# Patient Record
Sex: Female | Born: 1985
Health system: Southern US, Community
[De-identification: ages and names within clinical notes are randomized; demographics above are authoritative.]

## PROBLEM LIST (undated history)

## (undated) DIAGNOSIS — F4310 Post-traumatic stress disorder, unspecified: Secondary | ICD-10-CM

## (undated) DIAGNOSIS — I10 Essential (primary) hypertension: Secondary | ICD-10-CM

## (undated) DIAGNOSIS — Z86718 Personal history of other venous thrombosis and embolism: Secondary | ICD-10-CM

## (undated) HISTORY — PX: VENA CAVA FILTER PLACEMENT: SUR1032

## (undated) HISTORY — PX: ABDOMINAL HYSTERECTOMY: SHX81

---

## 2010-12-07 ENCOUNTER — Emergency Department (HOSPITAL_COMMUNITY)
Admission: EM | Admit: 2010-12-07 | Discharge: 2010-12-07 | Disposition: A | Discharge status: Home / Self Care | Payer: Self-pay | Attending: Emergency Medicine | Admitting: Emergency Medicine

## 2010-12-07 DIAGNOSIS — R112 Nausea with vomiting, unspecified: Secondary | ICD-10-CM | POA: Insufficient documentation

## 2010-12-07 DIAGNOSIS — K529 Noninfective gastroenteritis and colitis, unspecified: Secondary | ICD-10-CM

## 2010-12-07 DIAGNOSIS — R109 Unspecified abdominal pain: Secondary | ICD-10-CM | POA: Insufficient documentation

## 2010-12-07 DIAGNOSIS — N390 Urinary tract infection, site not specified: Secondary | ICD-10-CM | POA: Insufficient documentation

## 2010-12-07 DIAGNOSIS — Z86718 Personal history of other venous thrombosis and embolism: Secondary | ICD-10-CM | POA: Insufficient documentation

## 2010-12-07 DIAGNOSIS — I10 Essential (primary) hypertension: Secondary | ICD-10-CM | POA: Insufficient documentation

## 2010-12-07 DIAGNOSIS — K5289 Other specified noninfective gastroenteritis and colitis: Secondary | ICD-10-CM | POA: Insufficient documentation

## 2010-12-07 DIAGNOSIS — R197 Diarrhea, unspecified: Secondary | ICD-10-CM | POA: Insufficient documentation

## 2010-12-07 HISTORY — DX: Personal history of other venous thrombosis and embolism: Z86.718

## 2010-12-07 HISTORY — DX: Essential (primary) hypertension: I10

## 2010-12-07 LAB — URINALYSIS, ROUTINE W REFLEX MICROSCOPIC
Bilirubin Urine: NEGATIVE
Nitrite: NEGATIVE
Specific Gravity, Urine: 1.025 (ref 1.005–1.030)
Urobilinogen, UA: 1 mg/dL (ref 0.0–1.0)
pH: 7.5 (ref 5.0–8.0)

## 2010-12-07 LAB — PREGNANCY, URINE: Preg Test, Ur: NEGATIVE

## 2010-12-07 LAB — DIFFERENTIAL
Basophils Absolute: 0 10*3/uL (ref 0.0–0.1)
Basophils Relative: 0 % (ref 0–1)
Lymphocytes Relative: 38 % (ref 12–46)
Monocytes Absolute: 0.6 10*3/uL (ref 0.1–1.0)
Monocytes Relative: 8 % (ref 3–12)
Neutro Abs: 3.4 10*3/uL (ref 1.7–7.7)
Neutrophils Relative %: 49 % (ref 43–77)

## 2010-12-07 LAB — CBC
HCT: 43.8 % (ref 36.0–46.0)
Hemoglobin: 14.4 g/dL (ref 12.0–15.0)
MCHC: 32.9 g/dL (ref 30.0–36.0)
RBC: 5.53 MIL/uL — ABNORMAL HIGH (ref 3.87–5.11)
RDW: 13.8 % (ref 11.5–15.5)

## 2010-12-07 LAB — POCT I-STAT, CHEM 8
HCT: 48 % — ABNORMAL HIGH (ref 36.0–46.0)
Hemoglobin: 16.3 g/dL — ABNORMAL HIGH (ref 12.0–15.0)
Potassium: 4.4 mEq/L (ref 3.5–5.1)
Sodium: 140 mEq/L (ref 135–145)
TCO2: 28 mmol/L (ref 0–100)

## 2010-12-07 LAB — URINE MICROSCOPIC-ADD ON

## 2010-12-07 MED ORDER — SODIUM CHLORIDE 0.9 % IV SOLN
999.0000 mL | Freq: Once | INTRAVENOUS | Status: AC
  Administered 2010-12-07: 1000 mL via INTRAVENOUS

## 2010-12-07 MED ORDER — ONDANSETRON HCL 4 MG PO TABS: 4.0000 mg | ORAL_TABLET | Freq: Four times a day (QID) | ORAL | Status: AC

## 2010-12-07 MED ORDER — ONDANSETRON 4 MG PO TBDP
ORAL_TABLET | ORAL | Status: AC
  Administered 2010-12-07: 8 mg
  Filled 2010-12-07: qty 2

## 2010-12-07 MED ORDER — ONDANSETRON HCL 4 MG/2ML IJ SOLN
4.0000 mg | Freq: Once | INTRAMUSCULAR | Status: AC
  Administered 2010-12-07: 4 mg via INTRAVENOUS
  Filled 2010-12-07: qty 2

## 2010-12-07 NOTE — ED Notes (Signed)
Abdominal pain with n/v/d, symptoms began yesterday

## 2010-12-07 NOTE — ED Provider Notes (Signed)
History     CSN: [id number] Arrival date & time: 12/07/2010  9:02 AM   First MD Initiated Contact with Patient 12/07/10 [phone]      Chief Complaint  Patient presents with  . 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 female presenting with vomiting. The history is provided by the patient. No language interpreter was used.  Emesis  This is a new problem. The current episode started yesterday. The problem occurs 2 to 4 times per day. The problem has not changed since onset.The emesis has an appearance of stomach contents. There has been no fever. Associated symptoms include abdominal pain and diarrhea. Pertinent negatives include no arthralgias, no chills, no cough, no fever, no headaches, no myalgias and no URI. Risk factors include ill contacts.    Past Medical History  Diagnosis Date  . History of blood clots   . Hypertension     Past Surgical History  Procedure Date  . Abdominal hysterectomy     Family History  Problem Relation Age of Onset  . Hypertension Mother   . Hypertension Father     History  Substance Use Topics  . Smoking status: Never Smoker   . Smokeless tobacco: Never Used  . Alcohol Use: No    OB History    Grav Para Term Preterm Abortions TAB SAB Ect Mult Living                  Review of Systems  Constitutional: Negative for fever and chills.  Respiratory: Negative for cough.   Gastrointestinal: Positive for vomiting,  abdominal pain and diarrhea.  Musculoskeletal: Negative for myalgias and arthralgias.  Neurological: Negative for headaches.  All other systems reviewed and are negative.    Allergies  Review of patient's allergies indicates no known allergies.  Home Medications  No current outpatient prescriptions on file.  BP 113/63  Pulse 85  Temp(Src) 98.1 F (36.7 C) (Oral)  Resp 18  Ht 5\' 9"  (1.753 m)  Wt 220 lb (99.791 kg)  BMI 32.49 kg/m2  SpO2 98%  LMP 08/06/2010  Physical Exam  Nursing note and vitals reviewed. Constitutional: She is oriented to person, place, and time. She appears well-developed and well-nourished.  HENT:  Head: Normocephalic and atraumatic.  Right Ear: External ear normal.  Left Ear: External ear normal.  Mouth/Throat: Oropharynx is clear and moist.  Eyes: Pupils are equal, round, and reactive to light. No scleral icterus.  Neck: Normal range of motion. Neck supple.  Cardiovascular: Normal rate, regular rhythm, normal heart sounds and intact distal pulses.   Pulmonary/Chest: Effort normal and breath sounds normal. No respiratory distress.  Abdominal: Soft. Bowel sounds are normal. She exhibits no distension. There is no tenderness.  Musculoskeletal: Normal range of motion.  Neurological: She is alert and oriented to person, place, and time.  Skin: Skin is warm and dry.  Psychiatric: She has a normal mood and affect. Her behavior is normal. Judgment and thought content normal.    ED Course  Procedures (  including critical care time)  Labs Reviewed  CBC - Abnormal; Notable for the following:    RBC 5.53 (*)    All other components within normal limits  URINALYSIS, ROUTINE W REFLEX MICROSCOPIC - Abnormal; Notable for the following:    Appearance CLOUDY (*)    Leukocytes, UA MODERATE (*)    All other components within normal limits  POCT I-STAT, CHEM 8 - Abnormal; Notable for the following:    Hemoglobin 16.3 (*)    HCT 48.0 (*)    All other components  within normal limits  URINE MICROSCOPIC-ADD ON - Abnormal; Notable for the following:    Squamous Epithelial / LPF MANY (*)    Bacteria, UA FEW (*)    All other components within normal limits  DIFFERENTIAL  PREGNANCY, URINE  I-STAT, CHEM 8   No results found.   Gastroenteritis   MDM  ED course - patient was given a liter of fluids here and zofran 4mg  IV - she was then able to tolerate po fluids and crackers - she is also noted to have a UTI and I will treat this as well.        Izola Price [place], [place] 12/07/10 1226

## 2010-12-07 NOTE — ED Notes (Signed)
Pt states that she is feeling better after the zofran

## 2010-12-07 NOTE — ED Provider Notes (Signed)
Medical screening examination/treatment/procedure(s) were performed by non-physician practitioner and as supervising physician I was immediately available for consultation/collaboration.  Doug Sou, MD 12/07/10 [phone]

## 2010-12-07 NOTE — ED Notes (Signed)
Oral trial complete. Pt tolerating well.

## 2012-10-30 DIAGNOSIS — I82409 Acute embolism and thrombosis of unspecified deep veins of unspecified lower extremity: Secondary | ICD-10-CM | POA: Insufficient documentation

## 2013-02-19 DIAGNOSIS — I2699 Other pulmonary embolism without acute cor pulmonale: Secondary | ICD-10-CM | POA: Insufficient documentation

## 2013-02-22 DIAGNOSIS — N63 Unspecified lump in unspecified breast: Secondary | ICD-10-CM | POA: Insufficient documentation

## 2014-08-08 ENCOUNTER — Emergency Department (HOSPITAL_COMMUNITY)
Admission: EM | Admit: 2014-08-08 | Discharge: 2014-08-09 | Disposition: A | Discharge status: Home / Self Care | Payer: Medicaid Other | Attending: Emergency Medicine | Admitting: Emergency Medicine

## 2014-08-08 ENCOUNTER — Encounter (HOSPITAL_COMMUNITY): Payer: Self-pay | Admitting: Vascular Surgery

## 2014-08-08 DIAGNOSIS — G8929 Other chronic pain: Secondary | ICD-10-CM | POA: Diagnosis not present

## 2014-08-08 DIAGNOSIS — I10 Essential (primary) hypertension: Secondary | ICD-10-CM | POA: Insufficient documentation

## 2014-08-08 DIAGNOSIS — Z86718 Personal history of other venous thrombosis and embolism: Secondary | ICD-10-CM | POA: Diagnosis not present

## 2014-08-08 DIAGNOSIS — Z86711 Personal history of pulmonary embolism: Secondary | ICD-10-CM | POA: Insufficient documentation

## 2014-08-08 DIAGNOSIS — A5901 Trichomonal vulvovaginitis: Secondary | ICD-10-CM | POA: Diagnosis not present

## 2014-08-08 DIAGNOSIS — D6869 Other thrombophilia: Secondary | ICD-10-CM | POA: Diagnosis not present

## 2014-08-08 DIAGNOSIS — M79662 Pain in left lower leg: Secondary | ICD-10-CM | POA: Diagnosis present

## 2014-08-08 DIAGNOSIS — Z7901 Long term (current) use of anticoagulants: Secondary | ICD-10-CM | POA: Insufficient documentation

## 2014-08-08 DIAGNOSIS — A5900 Urogenital trichomoniasis, unspecified: Secondary | ICD-10-CM

## 2014-08-08 LAB — COMPREHENSIVE METABOLIC PANEL
ALBUMIN: 3.8 g/dL (ref 3.5–5.0)
ALK PHOS: 61 U/L (ref 38–126)
ALT: 17 U/L (ref 14–54)
ANION GAP: 9 (ref 5–15)
AST: 18 U/L (ref 15–41)
BUN: 11 mg/dL (ref 6–20)
CALCIUM: 8.8 mg/dL — AB (ref 8.9–10.3)
CHLORIDE: 104 mmol/L (ref 101–111)
CO2: 23 mmol/L (ref 22–32)
CREATININE: 0.96 mg/dL (ref 0.44–1.00)
GFR calc Af Amer: >60 mL/min (ref >60)
Glucose, Bld: 86 mg/dL (ref 65–99)
Potassium: 3.9 mmol/L (ref 3.5–5.1)
SODIUM: 136 mmol/L (ref 135–145)
Total Bilirubin: 0.4 mg/dL (ref 0.3–1.2)
Total Protein: 6.4 g/dL — ABNORMAL LOW (ref 6.5–8.1)

## 2014-08-08 LAB — CBC
HCT: 39.9 % (ref 36.0–46.0)
Hemoglobin: 12.8 g/dL (ref 12.0–15.0)
MCH: 25.5 pg — AB (ref 26.0–34.0)
MCHC: 32.1 g/dL (ref 30.0–36.0)
MCV: 79.6 fL (ref 78.0–100.0)
Platelets: 274 10*3/uL (ref 150–400)
RBC: 5.01 MIL/uL (ref 3.87–5.11)
RDW: 14.1 % (ref 11.5–15.5)
WBC: 8.3 10*3/uL (ref 4.0–10.5)

## 2014-08-08 LAB — LIPASE, BLOOD: LIPASE: 21 U/L — AB (ref 22–51)

## 2014-08-08 NOTE — ED Notes (Signed)
Pt reports to the ED for eval of left leg pain. She reports it feels similar to the last time she had a DVT in that leg. She is currently being treated with Coumadin but she has not had any since 2 months ago. She also has not had an INR check since then. Reports her Medicaid just got approved. No swelling or erythema noted to the affected leg. Pt also reports lower abd pain. Reports associated nausea and one episode of emesis. Denies any hematemesis or diarrhea. Denies any vaginal bleeding or d/c. Hx hysterectomy. Denies any fevers or chills. Pt A&Ox4, resp e/u, and skin warm and dry.

## 2014-08-08 NOTE — ED Notes (Signed)
Pt also requested pelvic exam to check for STDs as well.

## 2014-08-09 ENCOUNTER — Ambulatory Visit (HOSPITAL_COMMUNITY)
Admission: RE | Admit: 2014-08-09 | Discharge: 2014-08-09 | Disposition: A | Discharge status: Home / Self Care | Payer: Medicaid Other | Source: Ambulatory Visit | Attending: Emergency Medicine | Admitting: Emergency Medicine

## 2014-08-09 DIAGNOSIS — M79609 Pain in unspecified limb: Secondary | ICD-10-CM | POA: Diagnosis not present

## 2014-08-09 DIAGNOSIS — Z86711 Personal history of pulmonary embolism: Secondary | ICD-10-CM | POA: Insufficient documentation

## 2014-08-09 DIAGNOSIS — Z90710 Acquired absence of both cervix and uterus: Secondary | ICD-10-CM | POA: Diagnosis not present

## 2014-08-09 DIAGNOSIS — Z86718 Personal history of other venous thrombosis and embolism: Secondary | ICD-10-CM | POA: Diagnosis not present

## 2014-08-09 DIAGNOSIS — M79652 Pain in left thigh: Secondary | ICD-10-CM | POA: Insufficient documentation

## 2014-08-09 LAB — WET PREP, GENITAL
Clue Cells Wet Prep HPF POC: NONE SEEN
Yeast Wet Prep HPF POC: NONE SEEN

## 2014-08-09 LAB — URINALYSIS, ROUTINE W REFLEX MICROSCOPIC
Glucose, UA: NEGATIVE mg/dL
HGB URINE DIPSTICK: NEGATIVE
Ketones, ur: 15 mg/dL — AB
LEUKOCYTES UA: NEGATIVE
NITRITE: NEGATIVE
PH: 5.5 (ref 5.0–8.0)
PROTEIN: NEGATIVE mg/dL
SPECIFIC GRAVITY, URINE: 1.037 — AB (ref 1.005–1.030)
Urobilinogen, UA: 1 mg/dL (ref 0.0–1.0)

## 2014-08-09 LAB — GC/CHLAMYDIA PROBE AMP (~~LOC~~) NOT AT ARMC
Chlamydia: NEGATIVE
Neisseria Gonorrhea: NEGATIVE

## 2014-08-09 LAB — D-DIMER, QUANTITATIVE (NOT AT ARMC): D DIMER QUANT: 0.66 ug{FEU}/mL — AB (ref 0.00–0.48)

## 2014-08-09 LAB — HIV ANTIBODY (ROUTINE TESTING W REFLEX): HIV Screen 4th Generation wRfx: NONREACTIVE

## 2014-08-09 LAB — RPR: RPR Ser Ql: NONREACTIVE

## 2014-08-09 MED ORDER — METRONIDAZOLE 500 MG PO TABS
2000.0000 mg | ORAL_TABLET | Freq: Once | ORAL | Status: AC
  Administered 2014-08-09: 2000 mg via ORAL
  Filled 2014-08-09: qty 4

## 2014-08-09 MED ORDER — LIDOCAINE HCL (PF) 1 % IJ SOLN
0.9000 mL | Freq: Once | INTRAMUSCULAR | Status: AC
  Administered 2014-08-09: 0.9 mL

## 2014-08-09 MED ORDER — AZITHROMYCIN 250 MG PO TABS
1000.0000 mg | ORAL_TABLET | Freq: Once | ORAL | Status: AC
  Administered 2014-08-09: 1000 mg via ORAL
  Filled 2014-08-09: qty 4

## 2014-08-09 MED ORDER — XARELTO VTE STARTER PACK 15 & 20 MG PO TBPK: 15.0000 mg | ORAL_TABLET | ORAL | Status: DC

## 2014-08-09 MED ORDER — ENOXAPARIN SODIUM 100 MG/ML ~~LOC~~ SOLN
100.0000 mg | Freq: Once | SUBCUTANEOUS | Status: AC
  Administered 2014-08-09: 100 mg via SUBCUTANEOUS
  Filled 2014-08-09: qty 1

## 2014-08-09 MED ORDER — CEFTRIAXONE SODIUM 250 MG IJ SOLR
250.0000 mg | Freq: Once | INTRAMUSCULAR | Status: AC
Start: 2014-08-09 — End: 2014-08-09
  Administered 2014-08-09: 250 mg via INTRAMUSCULAR
  Filled 2014-08-09: qty 250

## 2014-08-09 NOTE — ED Provider Notes (Signed)
CSN: [id number]     Arrival date & time 08/08/14  2106 History   First MD Initiated Contact with Patient 08/08/14 2342     Chief Complaint  Patient presents with  . Leg Pain  . Abdominal Pain     (Consider location/radiation/quality/duration/timing/severity/associated sxs/prior Treatment) HPI Comments: This a 29 year old with history of PEs and DVTs, is supposed to be on Coumadin, who has not taken in the past 2 months due to financial concerns.  She also states that she has left lower quadrant and suprapubic pain and is concerned for an STD. Denies any recent trips trauma to her lower extremities, shortness of breath, chest pain. She states she was unable to afford physician office visits or her medications.  She recently has been reapproved for Medicaid so this will not issue in the future  Patient is a 29 y.o. female presenting with leg pain and abdominal pain. The history is provided by the patient.  Leg Pain Location:  Leg Injury: no   Leg location:  L lower leg Pain details:    Quality:  Aching   Radiates to:  Does not radiate   Severity:  Mild   Onset quality:  Gradual   Timing:  Constant   Progression:  Worsening Chronicity:  Chronic Dislocation: no   Foreign body present:  No foreign bodies Tetanus status:  Unknown Prior injury to area:  No Relieved by:  None tried Worsened by:  Activity Ineffective treatments:  None tried Associated symptoms: no fever   Risk factors comment:  Chronic DVTs and pulmonary emboli Abdominal Pain Associated symptoms: dysuria   Associated symptoms: no chest pain, no fever, no nausea, no shortness of breath, no vaginal discharge and no vomiting     Past Medical History  Diagnosis Date  . History of blood clots   . Hypertension    Past Surgical History  Procedure Laterality Date  . Abdominal hysterectomy     Family History  Problem Relation Age of Onset  . Hypertension Mother   . Hypertension Father    History  Substance Use  Topics  . Smoking status: Never Smoker   . Smokeless tobacco: Never Used  . Alcohol Use: No   OB History    No data available     Review of Systems  Constitutional: Negative for fever.  Respiratory: Negative for shortness of breath.   Cardiovascular: Positive for leg swelling. Negative for chest pain.  Gastrointestinal: Positive for abdominal pain. Negative for nausea and vomiting.  Genitourinary: Positive for dysuria and vaginal pain. Negative for frequency and vaginal discharge.  All other systems reviewed and are negative.     Allergies  Review of patient's allergies indicates no known allergies.  Home Medications   Prior to Admission medications   Medication Sig Start Date End Date Taking? Authorizing Provider  XARELTO STARTER PACK 15 & 20 MG TBPK Take 15-20 mg by mouth as directed. Take as directed on package: Start with one  tablet by mouth twice a day with food. On Day 22, switch to one  tablet once a day with food. 08/09/14   Earley Favor, NP   BP 108/54 mmHg  Pulse 68  Temp(Src) 97.4 F (36.3 C) (Oral)  Resp 16  SpO2 100%  LMP 08/06/2010 Physical Exam  Constitutional: She appears well-developed and well-nourished.  HENT:  Head: Normocephalic.  Eyes: Pupils are equal, round, and reactive to light.  Neck: Normal range of motion.  Cardiovascular: Normal rate.   Pulmonary/Chest: Effort normal and  breath sounds normal.  Abdominal: Soft. Bowel sounds are normal. She exhibits no distension. There is no tenderness.  Genitourinary: Uterus is not enlarged and not tender. Cervix exhibits motion tenderness and discharge. Left adnexum displays tenderness. Vaginal discharge found.  Musculoskeletal: Normal range of motion.  Neurological: She is alert.  Nursing note and vitals reviewed.   ED Course  Procedures (including critical care time) Labs Review Labs Reviewed  WET PREP, GENITAL - Abnormal; Notable for the following:    Trich, Wet Prep FEW (*)    WBC, Wet  Prep HPF POC FEW (*)    All other components within normal limits  LIPASE, BLOOD - Abnormal; Notable for the following:    Lipase 21 (*)    All other components within normal limits  COMPREHENSIVE METABOLIC PANEL - Abnormal; Notable for the following:    Calcium 8.8 (*)    Total Protein 6.4 (*)    All other components within normal limits  CBC - Abnormal; Notable for the following:    MCH 25.5 (*)    All other components within normal limits  D-DIMER, QUANTITATIVE (NOT AT [facility]) - Abnormal; Notable for the following:    D-Dimer, Quant 0.66 (*)    All other components within normal limits  URINALYSIS, ROUTINE W REFLEX MICROSCOPIC (NOT AT [facility]) - Abnormal; Notable for the following:    Color, Urine AMBER (*)    Specific Gravity, Urine 1.037 (*)    Bilirubin Urine SMALL (*)    Ketones, ur 15 (*)    All other components within normal limits  URINALYSIS, ROUTINE W REFLEX MICROSCOPIC (NOT AT [facility])  RPR  HIV ANTIBODY (ROUTINE TESTING)  GC/CHLAMYDIA PROBE AMP () NOT AT [facility]    Imaging Review No results found.   EKG Interpretation None     Patient has a positive d-dimer.  She was given her first dose of Lovenox in the emergency department.  She's been given a prescription for Xarelto starter pack, which she has been instructed to start tomorrow.  She's also been scheduled for an outpatient ultrasound of her left lower extremity tomorrow, and she's been instructed to call her primary care physician and set an appointment with in the next week She was tested positive for trichomonas.  She received 2 g off Flagyl in the emergency department, as well as receiving azithromycin and Rocephin for potential GC and Chlamydia cultures are pending MDM   Final diagnoses:  Urogenital infection by trichomonas vaginalis  Secondary hypercoagulability disorder         Earley Favor, NP 08/09/14 0150  Derwood Kaplan, MD 08/09/14 2213

## 2014-08-09 NOTE — Discharge Instructions (Signed)
Trichomoniasis Trichomoniasis is an infection caused by an organism called Trichomonas. The infection can affect both women and men. In women, the outer female genitalia and the vagina are affected. In men, the penis is mainly affected, but the prostate and other reproductive organs can also be involved. Trichomoniasis is a sexually transmitted infection (STI) and is most often passed to another person through sexual contact.  RISK FACTORS  Having unprotected sexual intercourse.  Having sexual intercourse with an infected partner. SIGNS AND SYMPTOMS  Symptoms of trichomoniasis in women include:  Abnormal gray-green frothy vaginal discharge.  Itching and irritation of the vagina.  Itching and irritation of the area outside the vagina. Symptoms of trichomoniasis in men include:   Penile discharge with or without pain.  Pain during urination. This results from inflammation of the urethra. DIAGNOSIS  Trichomoniasis may be found during a Pap test or physical exam. Your health care provider may use one of the following methods to help diagnose this infection:  Examining vaginal discharge under a microscope. For men, urethral discharge would be examined.  Testing the pH of the vagina with a test tape.  Using a vaginal swab test that checks for the Trichomonas organism. A test is available that provides results within a few minutes.  Doing a culture test for the organism. This is not usually needed. TREATMENT   You may be given medicine to fight the infection. Women should inform their health care provider if they could be or are pregnant. Some medicines used to treat the infection should not be taken during pregnancy.  Your health care provider may recommend over-the-counter medicines or creams to decrease itching or irritation.  Your sexual partner will need to be treated if infected. HOME CARE INSTRUCTIONS   Take medicines only as directed by your health care provider.  Take  over-the-counter medicine for itching or irritation as directed by your health care provider.  Do not have sexual intercourse while you have the infection.  Women should not douche or wear tampons while they have the infection.  Discuss your infection with your partner. Your partner may have gotten the infection from you, or you may have gotten it from your partner.  Have your sex partner get examined and treated if necessary.  Practice safe, informed, and protected sex.  See your health care provider for other STI testing. SEEK MEDICAL CARE IF:   You still have symptoms after you finish your medicine.  You develop abdominal pain.  You have pain when you urinate.  You have bleeding after sexual intercourse.  You develop a rash.  Your medicine makes you sick or makes you throw up (vomit). MAKE SURE YOU:  Understand these instructions.  Will watch your condition.  Will get help right away if you are not doing well or get worse. Document Released: 06/30/2000 Document Revised: 05/21/2013 Document Reviewed: 10/16/2012 [facility] Patient Information 2015 [place], [place]. This information is not intended to replace advice given to you by your health care provider. Make sure you discuss any questions you have with your health care provider.  Anticoagulation, Generic Anticoagulants are medicines used to prevent clots from developing in your veins. These medicine are also known as blood thinners. If blood clots are untreated, they could travel to your lungs. This is called a pulmonary embolus. A blood clot in your lungs can be fatal.  Health care providers often use anticoagulants to prevent clots following surgery. Anticoagulants are also used along with aspirin when the heart is not getting  enough blood. Another anticoagulant called warfarin is started 2 to 3 days after a rapid-acting injectable anticoagulant is started. The rapid-acting anticoagulants are usually continued until  warfarin has begun to work. Your health care provider will judge this length of time by blood tests known as the prothrombin time (PT) and International Normalization Ratio (INR). This means that your blood is at the necessary and best level to prevent clots. RISKS AND COMPLICATIONS  If you have received recent epidural anesthesia, spinal anesthesia, or a spinal tap while receiving anticoagulants, you are at risk for developing a blood clot in or around the spine. This condition could result in long-term or permanent paralysis.  Because anticoagulants thin your blood, severe bleeding may occur from any tissue or organ. Symptoms of the blood being too thin may include:  Bleeding from the nose or gums that does not stop quickly.  Blood in bowel movements which may appear as bright red, dark, or black tarry stools.  Blood in the urine which may appear as pink, red, or brown urine.  Unusual bruising or bruising easily.  A cut that does not stop bleeding within 10 minutes.  Vomiting blood or continuous nausea for more than 1 day.  Coughing up blood.  Broken blood vessels in your eye (subconjunctival hemorrhage).  Abdominal or back pain with or without flank bruising.  Sudden, severe headache.  Sudden weakness or numbness of the face, arm, or leg, especially on one side of the body.  Sudden confusion.  Trouble speaking (aphasia) or understanding.  Sudden trouble seeing in one or both eyes.  Sudden trouble walking.  Dizziness.  Loss of balance or coordination.  Vaginal bleeding.  Swelling or pain at an injection site.  Superficial fat tissue death (necrosis) which may cause skin scarring. This is more common in women and may first present as pain in the waist, thighs, or buttocks.  Fever.  Too little anticoagulation continues to allow the risk for blood clots. HOME CARE INSTRUCTIONS   Due to the complications of anticoagulants, it is very important that you take your  anticoagulant as directed by your health care provider. Anticoagulants need to be taken exactly as instructed. Be sure you understand all your anticoagulant instructions.  Keep all follow-up appointments with your health care provider as directed. It is very important to keep your appointments. Not keeping appointments could result in a chronic or permanent injury, pain, or disability.  Warfarin. Your health care provider will advise you on the length of treatment (usually 3-6 months, sometimes lifelong).  Take warfarin exactly as directed by your health care provider. It is recommended that you take your warfarin dose at the same time of the day. It is preferred that you take warfarin in the late afternoon. If you have been told to stop taking warfarin, do not resume taking warfarin until directed to do so by your health care provider. Follow your health care provider's instructions if you accidentally take an extra dose or miss a dose of warfarin. It is very important to take warfarin as directed since bleeding or blood clots could result in chronic or permanent injury, pain, or disability.  Too much and too little warfarin are both dangerous. Too much warfarin increases the risk of bleeding. Too little warfarin continues to allow the risk for blood clots. While taking warfarin, you will need to have regular blood tests to measure your blood clotting time. These blood tests usually include both the prothrombin time (PT) and International Normalized Ratio (INR)  tests. The PT and INR results allow your health care provider to adjust your dose of warfarin. The dose can change for many reasons. It is critically important that you have your PT and INR levels drawn exactly as directed. Your warfarin dose may stay the same or change depending on what the PT and INR results are. Be sure to follow up with your health care provider regarding your PT and INR test results and what your warfarin dosage should  be.  Many medicines can interfere with warfarin and affect the PT and INR results. You must tell your health care provider about any and all medicines you take, this includes all vitamins and supplements. Ask your health care provider before taking these. Prescription and over-the-counter medicine consistency is critical to warfarin management. It is important that potential interactions are checked before you start a new medicine. Be especially cautious with aspirin and anti-inflammatory medicines. Ask your health care provider before taking these. Medicines such as antibiotics and acid-reducing medicine can interact with warfarin and can cause an increased warfarin effect. Warfarin can also interfere with the effectiveness of medicines you are taking. Do not take or discontinue any prescribed or over-the-counter medicine except on the advice of your health care provider or pharmacist.  Some vitamins, supplements, and herbal products interfere with the effectiveness of warfarin. Vitamin E may increase the anticoagulant effects of warfarin. Vitamin K may can cause warfarin to be less effective. Do not take or discontinue any vitamin, supplement, or herbal product except on the advice of your health care provider or pharmacist.  Eat what you normally eat and keep the vitamin K content of your diet consistent. Avoid major changes in your diet, or notify your health care provider before changing your diet. Suddenly getting a lot more vitamin K could cause your blood to clot too quickly. A sudden decrease in vitamin K intake could cause your blood to clot too slowly. These changes in vitamin K intake could lead to dangerous blood clotsor to bleeding. To keep your vitamin K intake consistent, you must be aware of which foods contain moderate or high amounts of vitamin K. Some foods high in vitamin K include spinach, kale, broccoli, cabbage, greens, Brussels sprouts, asparagus, Bok [place]oy, coleslaw, parsley, and  green tea. Arrange a visit with a dietitian to answer your questions.  If you have a loss of appetite or get the stomach flu (viral gastroenteritis), talk to your health care provider as soon as possible. A decrease in your normal vitamin K intake can make you more sensitive to your usual dose of warfarin.  Some medical conditions may increase your risk for bleeding while you are taking warfarin. A fever, diarrhea lasting more than a day, worsening heart failure, or worsening liver function are some medical conditions that could affect warfarin. Contact your health care provider if you have any of these medical conditions.  Alcohol can change the body's ability to handle warfarin. It is best to avoid alcoholic drinks or consume only very small amounts while taking warfarin. Notify your health care provider if you change your alcohol intake. A sudden increase in alcohol use can increase your risk of bleeding. Chronic alcohol use can cause warfarin to be less effective.  Be careful not to cut yourself when using sharp objects or while shaving.  Inform all your health care providers and your dentist that you take an anticoagulant.  Limit physical activities or sports that could result in a fall or cause injury. Avoid  contact sports.  Wear medical alert jewelry or carry a medical alert card. SEEK IMMEDIATE MEDICAL CARE IF:  You cough up blood.  You have dark or black stools or there is bright red blood coming from your rectum.  You vomit blood or have nausea for more than 1 day.  You have blood in the urine or pink colored urine.  You have unusual bruising or have increased bruising.  You have bleeding from the nose or gums that does not stop quickly.  You have a cut that does not stop bleeding within a 2-3 minutes.  You have sudden weakness or numbness of the face, arm, or leg, especially on one side of the body.  You have sudden confusion.  You have trouble speaking (aphasia) or  understanding.  You have sudden trouble seeing in one or both eyes.  You have sudden trouble walking.  You have dizziness.  You have a loss of balance or coordination.  You have a sudden, severe headache.  You have a serious fall or head injury, even if you are not bleeding.  You have swelling or pain at an injection site.  You have unexplained tenderness or pain in the abdomen, back, waist, thighs or buttocks.  You have a fever. Any of these symptoms may represent a serious problem that is an emergency. Do not wait to see if the symptoms will go away. Get medical help right away. Call your local emergency services (911 in [place] Do not drive yourself to the hospital. Document Released: 01/04/2005 Document Revised: 01/09/2013 Document Reviewed: 08/09/2007 [facility] Patient Information 2015 [place], [place]. This information is not intended to replace advice given to you by your health care provider. Make sure you discuss any questions you have with your health care provider. You have been given your first dose of Lovenox in the emergency department tonight.  You've been scheduled for an outpatient Doppler study of your left lower leg tomorrow.  You've also been given a prescription for medication called Xarelto as a starter pack.  Please follow the directions on the label.  Please make an appointment with your primary care physician to be seen within the next week

## 2014-08-09 NOTE — ED Notes (Signed)
Patient is alert and orientedx4.  Patient was explained discharge instructions and they understood them with no questions.  The patient's sister, Glennie Hawk is taking the patient home.

## 2014-08-09 NOTE — Progress Notes (Signed)
VASCULAR LAB PRELIMINARY  PRELIMINARY  PRELIMINARY  PRELIMINARY  Left lower extremity venous duplex completed.    Preliminary report:  Left:  No evidence of DVT, superficial thrombosis, or Baker's cyst.  Arine Foley, RVT 08/09/2014, 9:55 AM

## 2014-08-30 ENCOUNTER — Emergency Department (HOSPITAL_COMMUNITY)
Admission: EM | Admit: 2014-08-30 | Discharge: 2014-08-30 | Disposition: A | Discharge status: Home / Self Care | Payer: Medicaid Other | Attending: Emergency Medicine | Admitting: Emergency Medicine

## 2014-08-30 ENCOUNTER — Encounter (HOSPITAL_COMMUNITY): Payer: Self-pay | Admitting: Emergency Medicine

## 2014-08-30 ENCOUNTER — Emergency Department (HOSPITAL_COMMUNITY): Payer: Medicaid Other

## 2014-08-30 DIAGNOSIS — Z8659 Personal history of other mental and behavioral disorders: Secondary | ICD-10-CM | POA: Diagnosis not present

## 2014-08-30 DIAGNOSIS — R061 Stridor: Secondary | ICD-10-CM | POA: Insufficient documentation

## 2014-08-30 DIAGNOSIS — Z7901 Long term (current) use of anticoagulants: Secondary | ICD-10-CM | POA: Insufficient documentation

## 2014-08-30 DIAGNOSIS — R791 Abnormal coagulation profile: Secondary | ICD-10-CM | POA: Insufficient documentation

## 2014-08-30 DIAGNOSIS — R110 Nausea: Secondary | ICD-10-CM | POA: Insufficient documentation

## 2014-08-30 DIAGNOSIS — M5442 Lumbago with sciatica, left side: Secondary | ICD-10-CM | POA: Insufficient documentation

## 2014-08-30 DIAGNOSIS — Z87891 Personal history of nicotine dependence: Secondary | ICD-10-CM | POA: Diagnosis not present

## 2014-08-30 DIAGNOSIS — R079 Chest pain, unspecified: Secondary | ICD-10-CM | POA: Insufficient documentation

## 2014-08-30 DIAGNOSIS — R197 Diarrhea, unspecified: Secondary | ICD-10-CM | POA: Diagnosis not present

## 2014-08-30 DIAGNOSIS — I10 Essential (primary) hypertension: Secondary | ICD-10-CM | POA: Insufficient documentation

## 2014-08-30 DIAGNOSIS — Z86711 Personal history of pulmonary embolism: Secondary | ICD-10-CM | POA: Insufficient documentation

## 2014-08-30 DIAGNOSIS — Z86718 Personal history of other venous thrombosis and embolism: Secondary | ICD-10-CM | POA: Insufficient documentation

## 2014-08-30 HISTORY — DX: Post-traumatic stress disorder, unspecified: F43.10

## 2014-08-30 LAB — BASIC METABOLIC PANEL
Anion gap: 6 (ref 5–15)
BUN: 7 mg/dL (ref 6–20)
CHLORIDE: 108 mmol/L (ref 101–111)
CO2: 27 mmol/L (ref 22–32)
CREATININE: 0.79 mg/dL (ref 0.44–1.00)
Calcium: 9.1 mg/dL (ref 8.9–10.3)
GFR calc non Af Amer: >60 mL/min (ref >60)
GLUCOSE: 89 mg/dL (ref 65–99)
Potassium: 3.8 mmol/L (ref 3.5–5.1)
Sodium: 141 mmol/L (ref 135–145)

## 2014-08-30 LAB — I-STAT TROPONIN, ED: Troponin i, poc: 0 ng/mL (ref 0.00–0.08)

## 2014-08-30 LAB — CBC
HEMATOCRIT: 40.8 % (ref 36.0–46.0)
HEMOGLOBIN: 13.1 g/dL (ref 12.0–15.0)
MCH: 25.7 pg — AB (ref 26.0–34.0)
MCHC: 32.1 g/dL (ref 30.0–36.0)
MCV: 80 fL (ref 78.0–100.0)
Platelets: 274 10*3/uL (ref 150–400)
RBC: 5.1 MIL/uL (ref 3.87–5.11)
RDW: 14.2 % (ref 11.5–15.5)
WBC: 6.1 10*3/uL (ref 4.0–10.5)

## 2014-08-30 LAB — URINALYSIS, ROUTINE W REFLEX MICROSCOPIC
BILIRUBIN URINE: NEGATIVE
GLUCOSE, UA: NEGATIVE mg/dL
Hgb urine dipstick: NEGATIVE
KETONES UR: NEGATIVE mg/dL
Leukocytes, UA: NEGATIVE
Nitrite: NEGATIVE
PROTEIN: NEGATIVE mg/dL
Specific Gravity, Urine: 1.023 (ref 1.005–1.030)
Urobilinogen, UA: 1 mg/dL (ref 0.0–1.0)
pH: 7.5 (ref 5.0–8.0)

## 2014-08-30 LAB — PROTIME-INR
INR: 0.96 (ref 0.00–1.49)
PROTHROMBIN TIME: 12.9 s (ref 11.6–15.2)

## 2014-08-30 MED ORDER — PREDNISONE 20 MG PO TABS
60.0000 mg | ORAL_TABLET | Freq: Once | ORAL | Status: AC
  Administered 2014-08-30: 60 mg via ORAL
  Filled 2014-08-30: qty 3

## 2014-08-30 MED ORDER — PREDNISONE 20 MG PO TABS: 40.0000 mg | ORAL_TABLET | Freq: Every day | ORAL | Status: DC

## 2014-08-30 MED ORDER — IOHEXOL 350 MG/ML SOLN
80.0000 mL | Freq: Once | INTRAVENOUS | Status: AC | PRN
  Administered 2014-08-30: 80 mL via INTRAVENOUS

## 2014-08-30 MED ORDER — SODIUM CHLORIDE 0.9 % IV BOLUS (SEPSIS)
1000.0000 mL | Freq: Once | INTRAVENOUS | Status: AC
  Administered 2014-08-30: 1000 mL via INTRAVENOUS

## 2014-08-30 MED ORDER — ONDANSETRON HCL 4 MG/2ML IJ SOLN
4.0000 mg | Freq: Once | INTRAMUSCULAR | Status: AC
  Administered 2014-08-30: 4 mg via INTRAVENOUS
  Filled 2014-08-30: qty 2

## 2014-08-30 MED ORDER — HYDROCODONE-ACETAMINOPHEN 5-325 MG PO TABS: 2.0000 | ORAL_TABLET | ORAL | Status: DC | PRN

## 2014-08-30 MED ORDER — KETOROLAC TROMETHAMINE 30 MG/ML IJ SOLN
30.0000 mg | Freq: Once | INTRAMUSCULAR | Status: AC
  Administered 2014-08-30: 30 mg via INTRAVENOUS
  Filled 2014-08-30: qty 1

## 2014-08-30 NOTE — ED Notes (Signed)
Patient c/o lower back pain and left hip pain x 1 month.  Patient states she began having throbbing chest pain at 2am this morning.  Patient denies nausea, shortness of breath or diaphoresis.  Patient states she had diarrhea yesterday.

## 2014-08-30 NOTE — ED Notes (Signed)
Pt brought back to room via wheelchair; pt undressed, in gown, on monitor, continuous pulse oximetry and blood pressure cuff; French Ana, RN present in room

## 2014-08-30 NOTE — ED Provider Notes (Signed)
CSN: [id number]     Arrival date & time 08/30/14  1018 History   First MD Initiated Contact with Patient 08/30/14 1023     Chief Complaint  Patient presents with  . Chest Pain  . 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  Her chest pain began at 2 AM, and is located in her central chest him and described as a "poking, stinging, pulling," sensation which is worsened with laying down, deep inspiration and worse with exertion, is intermittent in nature, and is associated with shortness of breath, orthopnea and PND. She states that she sleeps better and has less pain when propped up at night, she often wakes up when trying to lay flat, feeling short of breath and "in a panic."  She denies any cough, cold-like symptoms, wheeze, lower extremity edema, or fatigue.   Patient is also complaining of left sided, low back and left buttock pain that radiates to the back of her left leg with associated tingling and numbness. It is made worse with movement and with any position that puts her weight on her left side.  She has had difficulty driving and has to sit on the pillow. She states she has had this pain since about 2007, and on his done anything for it but give her pain meds.  Her back and leg pain is bothering her worse than her chest pain and due to severely increased mobility of the primary reason she has come in to be evaluated today. Patient also has had 2 episodes of dark green watery diarrhea which began when she woke up at 2 AM. She has some associated nausea, but does not have fever, chills or sweats. She has not had any vomiting. She denies hematochezia, melena, abdominal pain, sick contacts, recent travel, or exposure to new foods or undercooked  foods.  Past Medical History  Diagnosis Date  . History of blood clots   . Hypertension   . PTSD (post-traumatic stress disorder)    Past Surgical History  Procedure Laterality Date  . Abdominal hysterectomy     Family History  Problem Relation Age of Onset  . Hypertension Mother   . Hypertension Father    Social History  Substance Use Topics  . Smoking status: Former Games developer  . Smokeless tobacco: Never Used  . Alcohol Use: No   OB History    No data available     Review of Systems  Constitutional: Negative.   Eyes: Negative.   Respiratory: Positive for stridor. Negative for cough, choking, chest tightness and wheezing.   Cardiovascular: Negative for palpitations and leg swelling.  Gastrointestinal: Positive for nausea and diarrhea. Negative for vomiting, abdominal pain, constipation, blood in stool, abdominal distention, anal bleeding and rectal pain.  Endocrine: Negative.   Genitourinary: Negative.   Musculoskeletal: Positive for back pain and gait problem. Negative for myalgias, joint swelling, neck pain and neck stiffness.  Neurological: Negative.   Hematological: Negative.   Psychiatric/Behavioral: Negative.       Allergies  Review of patient's allergies indicates no known allergies.  Home Medications   Prior to Admission medications   Medication Sig Start Date End Date Taking? Authorizing Provider  XARELTO STARTER PACK 15 & 20 MG TBPK Take 15-20 mg by mouth as directed. Take as directed on package: Start with one  15mg  tablet by mouth twice a day with food. On Day 22, switch to one 20mg  tablet once a day with food. 08/09/14   Earley Favor, NP   BP 133/69 mmHg  Pulse 75  Temp(Src) 98.2 F (36.8 C) (Oral)  Resp 20  Ht 5\' 9"  (1.753 m)  Wt 220 lb (99.791 kg)  BMI 32.47 kg/m2  SpO2 100%  LMP 08/06/2010 Physical Exam  Constitutional: She is oriented to person, place, and time. She appears well-developed and well-nourished. No distress.  HENT:  Head:  Normocephalic and atraumatic.  Nose: Nose normal.  Mouth/Throat: Oropharynx is clear and moist. No oropharyngeal exudate.  Eyes: Conjunctivae and EOM are normal. Pupils are equal, round, and reactive to light. Right eye exhibits no discharge. Left eye exhibits no discharge. No scleral icterus.  Neck: Normal range of motion. No JVD present. No tracheal deviation present. No thyromegaly present.  Cardiovascular: Normal rate, regular rhythm, normal heart sounds and intact distal pulses.  Exam reveals no gallop and no friction rub.   No murmur heard. Pulmonary/Chest: Effort normal and breath sounds normal. No accessory muscle usage. No respiratory distress. She has no decreased breath sounds. She has no wheezes. She has no rhonchi. She has no rales. She exhibits no tenderness. Left breast exhibits no nipple discharge, no skin change and no tenderness.    Lungs clear to auscultation anteriorly and posteriorly without crackles, wheezes or rhonchi, no increased work of breathing, no respiratory distress. With deep inspiration patient experienced pain  Abdominal: Soft. Bowel sounds are normal. She exhibits no distension and no mass. There is no tenderness. There is no rebound and no guarding.  Abdomen soft, obese, nontender, nondistended, normal bowel sounds 4, no guarding or rebound  Musculoskeletal: She exhibits tenderness. She exhibits no edema.  No spinal tenderness, from cervical to lumbar spine, no step-off palpated on spinous processes. Tender to palpation over left SI joint,  Positive modified straight leg raise, left hip normal ROM, but painful flexion and extension.   Left knee normal ROM  Lymphadenopathy:    She has no cervical adenopathy.  Neurological: She is alert and oriented to person, place, and time. She has normal reflexes. No cranial nerve deficit or sensory deficit. She exhibits normal muscle tone. Gait abnormal. Coordination normal.  Antalgic gait  Skin: Skin is warm and dry. No  rash noted. She is not diaphoretic. No erythema. No pallor.  No lower extremity edema, all extremities warm to the touch, no pallor, normal cap refill  Psychiatric: She has a normal mood and affect. Her behavior is normal. Judgment and thought content normal.  Nursing note and vitals reviewed.   ED Course  Procedures (including critical care time) Labs Review Labs Reviewed  CBC - Abnormal; Notable for the following:    MCH 25.7 (*)    All other components within normal limits  BASIC METABOLIC PANEL  I-STAT TROPOININ, ED    Imaging Review No results found. I, Danelle Berry, personally reviewed and evaluated these images and lab results as part of my medical decision-making.   EKG Interpretation None      MDM   Final diagnoses:  None      Pt with CP, negative trop, neg CT angio chest, suptherapeutic INR -  Double dose for 2 days and contact managing doctor for follow-up, if unable to be seen (pt states difficulty getting anyone to answer or return phone calls) pt advised to present back to ED to monitor pt/inr. Pt was aware of lump  on left breast - pt notified of incidental findings on CT angio - pt will f/up with OB/GYN. Pt had sciatic back pain on the left - given tx with pain meds and steroid taper.      Danelle Berry, PA-C 09/05/14 1610  Jerelyn Scott, MD 09/06/14 [phone]

## 2014-08-30 NOTE — ED Notes (Signed)
Discharge instructions and prescriptions given and reviewed with patient.  Patient verbalized understanding to take medications as directed and to follow up with PMD as needed.  Patient discharged home in good condition.  

## 2014-08-30 NOTE — Discharge Instructions (Signed)
Your INR level was found to be too low to be therapeutic.  Double your dose for 2 days and call your doctor to follow up on your dosing and levels.    Chest Wall Pain Chest wall pain is pain felt in or around the chest bones and muscles. It may take up to 6 weeks to get better. It may take longer if you are active. Chest wall pain can happen on its own. Other times, things like germs, injury, coughing, or exercise can cause the pain. HOME CARE   Avoid activities that make you tired or cause pain. Try not to use your chest, belly (abdominal), or side muscles. Do not use heavy weights.  Put ice on the sore area.  Put ice in a plastic bag.  Place a towel between your skin and the bag.  Leave the ice on for 15-20 minutes for the first 2 days.  Only take medicine as told by your doctor. GET HELP RIGHT AWAY IF:   You have more pain or are very uncomfortable.  You have a fever.  Your chest pain gets worse.  You have new problems.  You feel sick to your stomach (nauseous) or throw up (vomit).  You start to sweat or feel lightheaded.  You have a cough with mucus (phlegm).  You cough up blood. MAKE SURE YOU:   Understand these instructions.  Will watch your condition.  Will get help right away if you are not doing well or get worse. Document Released: 06/23/2007 Document Revised: 03/29/2011 Document Reviewed: 08/31/2010 [facility] Patient Information 2015 [place], [place]. This information is not intended to replace advice given to you by your health care provider. Make sure you discuss any questions you have with your health care provider.  Sciatica Sciatica is pain, weakness, numbness, or tingling along the path of the sciatic nerve. The nerve starts in the lower back and runs down the back of each leg. The nerve controls the muscles in the lower leg and in the back of the knee, while also providing sensation to the back of the thigh, lower leg, and the sole of your foot. Sciatica  is a symptom of another medical condition. For instance, nerve damage or certain conditions, such as a herniated disk or bone spur on the spine, pinch or put pressure on the sciatic nerve. This causes the pain, weakness, or other sensations normally associated with sciatica. Generally, sciatica only affects one side of the body. CAUSES   Herniated or slipped disc.  Degenerative disk disease.  A pain disorder involving the narrow muscle in the buttocks (piriformis syndrome).  Pelvic injury or fracture.  Pregnancy.  Tumor (rare). SYMPTOMS  Symptoms can vary from mild to very severe. The symptoms usually travel from the low back to the buttocks and down the back of the leg. Symptoms can include:  Mild tingling or dull aches in the lower back, leg, or hip.  Numbness in the back of the calf or sole of the foot.  Burning sensations in the lower back, leg, or hip.  Sharp pains in the lower back, leg, or hip.  Leg weakness.  Severe back pain inhibiting movement. These symptoms may get worse with coughing, sneezing, laughing, or prolonged sitting or standing. Also, being overweight may worsen symptoms. DIAGNOSIS  Your caregiver will perform a physical exam to look for common symptoms of sciatica. He or she may ask you to do certain movements or activities that would trigger sciatic nerve pain. Other tests may be  performed to find the cause of the sciatica. These may include:  Blood tests.  X-rays.  Imaging tests, such as an MRI or CT scan. TREATMENT  Treatment is directed at the cause of the sciatic pain. Sometimes, treatment is not necessary and the pain and discomfort goes away on its own. If treatment is needed, your caregiver may suggest:  Over-the-counter medicines to relieve pain.  Prescription medicines, such as anti-inflammatory medicine, muscle relaxants, or narcotics.  Applying heat or ice to the painful area.  Steroid injections to lessen pain, irritation, and  inflammation around the nerve.  Reducing activity during periods of pain.  Exercising and stretching to strengthen your abdomen and improve flexibility of your spine. Your caregiver may suggest losing weight if the extra weight makes the back pain worse.  Physical therapy.  Surgery to eliminate what is pressing or pinching the nerve, such as a bone spur or part of a herniated disk. HOME CARE INSTRUCTIONS   Only take over-the-counter or prescription medicines for pain or discomfort as directed by your caregiver.  Apply ice to the affected area for 20 minutes, 3-4 times a day for the first 48-72 hours. Then try heat in the same way.  Exercise, stretch, or perform your usual activities if these do not aggravate your pain.  Attend physical therapy sessions as directed by your caregiver.  Keep all follow-up appointments as directed by your caregiver.  Do not wear high heels or shoes that do not provide proper support.  Check your mattress to see if it is too soft. A firm mattress may lessen your pain and discomfort. SEEK IMMEDIATE MEDICAL CARE IF:   You lose control of your bowel or bladder (incontinence).  You have increasing weakness in the lower back, pelvis, buttocks, or legs.  You have redness or swelling of your back.  You have a burning sensation when you urinate.  You have pain that gets worse when you lie down or awakens you at night.  Your pain is worse than you have experienced in the past.  Your pain is lasting longer than 4 weeks.  You are suddenly losing weight without reason. MAKE SURE YOU:  Understand these instructions.  Will watch your condition.  Will get help right away if you are not doing well or get worse. Document Released: 12/29/2000 Document Revised: 07/06/2011 Document Reviewed: 05/16/2011 [facility] Patient Information 2015 [place], [place]. This information is not intended to replace advice given to you by your health care provider. Make sure you  discuss any questions you have with your health care provider.  Vitamin K Foods and Warfarin Warfarin is a medicine that helps prevent harmful blood clots by causing blood to clot more slowly. It does this by decreasing the activity of vitamin K, which promotes normal blood clotting. For the dose of warfarin you have been prescribed to work well, you need to get about the same amount of vitamin K from your food from day to day. Suddenly getting a lot more vitamin K could cause your blood to clot too quickly. A sudden decrease in vitamin K intake could cause your blood to clot too slowly. These changes in vitamin K intake could lead to dangerous blood clotsor to bleeding. WHAT GENERAL GUIDELINES DO I NEED TO FOLLOW?  Keep your intake of vitamin K consistent from day to day. To do this, you must be aware of which foods contain moderate or high amounts of vitamin K. Listed below are some foods that are very high, high,  or moderately high in vitamin K. If you eat these foods, make sure you eat a consistent amount of them from day to day.  Avoid major changes in your diet, or tell your health care provider before changing your diet.  If you take a multivitamin that contains vitamin K, be sure to take it every day.  If you drink green tea, drink the same amount each day. WHAT FOODS ARE VERY HIGH IN VITAMIN K?   Greens, such as Swiss chard and beet, collard, mustard, or turnip greens (fresh or frozen, cooked).  Kale (fresh or frozen, cooked).   Parsley (raw).  Spinach (cooked).  WHAT FOODS ARE HIGH IN VITAMIN K?  Asparagus (frozen, cooked).  Beans, green (frozen, cooked).  Broccoli.   Bok choy (cooked).   Brussels sprouts (fresh or frozen, cooked).  Cabbage (cooked).  Coleslaw. WHAT FOODS ARE MODERATELY HIGH IN VITAMIN K?  Blueberries.  Black-eyed peas.  Endive (raw).   Green leaf lettuce (raw).   Green scallions (raw).  Kale (raw).  Okra (frozen,  cooked).  Plantains (fried).  Romaine lettuce (raw).   Sauerkraut (canned).   Spinach (raw). Document Released: 11/01/2008 Document Revised: 01/09/2013 Document Reviewed: 11/08/2012 [facility] Patient Information 2015 [place], [place]. This information is not intended to replace advice given to you by your health care provider. Make sure you discuss any questions you have with your health care provider.  Piriformis Syndrome with Rehab Piriformis syndrome is a condition the affects the nervous system in the area of the hip, and is characterized by pain and possibly a loss of feeling in the backside (posterior) thigh that may extend down the entire length of the leg. The symptoms are caused by an increase in pressure on the sciatic nerve by the piriformis muscle, which is on the back of the hip and is responsible for externally rotating the hip. The sciatic nerve and its branches connect to much of the leg. Normally the sciatic nerve runs between the piriformis muscle and other muscles. However, in certain individuals the nerve runs through the muscle, which causes an increase in pressure on the nerve and results in the symptoms of piriformis syndrome. SYMPTOMS   Pain, tingling, numbness, or burning in the back of the thigh that may also extend down the entire leg.  Occasionally, tenderness in the buttock.  Loss of function of the leg.  Pain that worsens when using the piriformis muscle (running, jumping, or stairs).  Pain that increases with prolonged sitting.  Pain that is lessened by lying flat on the back. CAUSES   Piriformis syndrome is the result of an increase in pressure placed on the sciatic nerve. Oftentimes, piriformis syndrome is an overuse injury.  Stress placed on the nerve from a sudden increase in the intensity, frequency, or duration of training.  Compensation of other extremity injuries. RISK INCREASES WITH:  Sports that involve the piriformis muscle (running,  walking, or jumping).  You are born with (congenital) a defect in which the sciatic nerve passes through the muscle. PREVENTION  Warm up and stretch properly before activity.  Allow for adequate recovery between workouts.  Maintain physical fitness:  Strength, flexibility, and endurance.  Cardiovascular fitness. PROGNOSIS  If treated properly, the symptoms of piriformis syndrome usually resolve in 2 to 6 weeks. RELATED COMPLICATIONS   Persistent and possibly permanent pain and numbness in the lower extremity.  Weakness of the extremity that may progress to disability and inability to compete. TREATMENT  The most effective treatment for piriformis  syndrome is rest from any activities that aggravate the symptoms. Ice and pain medication may help reduce pain and inflammation. The use of strengthening and stretching exercises may help reduce pain with activity. These exercises may be performed at home or with a therapist. A referral to a therapist may be given for further evaluation and treatment, such as ultrasound. Corticosteroid injections may be given to reduce inflammation that is causing pressure to be placed on the sciatic nerve. If nonsurgical (conservative) treatment is unsuccessful, then surgery may be recommended.  MEDICATION   If pain medication is necessary, then nonsteroidal anti-inflammatory medications, such as aspirin and ibuprofen, or other minor pain relievers, such as acetaminophen, are often recommended.  Do not take pain medication for 7 days before surgery.  Prescription pain relievers may be given if deemed necessary by your caregiver. Use only as directed and only as much as you need.  Corticosteroid injections may be given by your caregiver. These injections should be reserved for the most serious cases, because they may only be given a certain number of times. HEAT AND COLD:   Cold treatment (icing) relieves pain and reduces inflammation. Cold treatment should  be applied for 10 to 15 minutes every 2 to 3 hours for inflammation and pain and immediately after any activity that aggravates your symptoms. Use ice packs or massage the area with a piece of ice (ice massage).  Heat treatment may be used prior to performing the stretching and strengthening activities prescribed by your caregiver, physical therapist, or athletic trainer. Use a heat pack or soak the injury in warm water. SEEK IMMEDIATE MEDICAL CARE IF:  Treatment seems to offer no benefit, or the condition worsens.  Any medications produce adverse side effects. EXERCISES RANGE OF MOTION (ROM) AND STRETCHING EXERCISES - Piriformis Syndrome These exercises may help you when beginning to rehabilitate your injury. Your symptoms may resolve with or without further involvement from your physician, physical therapist, or athletic trainer. While completing these exercises, remember:   Restoring tissue flexibility helps normal motion to return to the joints. This allows healthier, less painful movement and activity.  An effective stretch should be held for at least 30 seconds.  A stretch should never be painful. You should only feel a gentle lengthening or release in the stretched tissue. STRETCH - Hip Rotators  Lie on your back on a firm surface. Grasp your right / left knee with your right / left hand and your ankle with your opposite hand.  Keeping your hips and shoulders firmly planted, gently pull your right / left knee and rotate your lower leg toward your opposite shoulder until you feel a stretch in your buttocks.  Hold this stretch for __________ seconds. Repeat this stretch __________ times. Complete this stretch __________ times per day. STRETCH - Iliotibial Band  On the floor or bed, lie on your side so your right / left leg is on top. Bend your knee and grab your ankle.  Slowly bring your knee back so that your thigh is in line with your trunk. Keep your heel at your buttocks and  gently arch your back so your head, shoulders, and hips line up.  Slowly lower your leg so that your knee approaches the floor/bed until you feel a gentle stretch on the outside of your right / left thigh. If you do not feel a stretch and your knee will not fall farther, place the heel of your opposite foot on top of your knee and pull your thigh  down farther.  Hold this stretch for __________ seconds. Repeat __________ times. Complete __________ times per day. STRENGTHENING EXERCISES - Piriformis Syndrome  These are some of the caregiver again or until your symptoms are resolved. Remember:   Strong muscles with good endurance tolerate stress better.  Do the exercises as initially prescribed by your caregiver. Progress slowly with each exercise, gradually increasing the number of repetitions and weight used under their guidance. STRENGTH - Hip Abductors, Straight Leg Raises Be aware of your form throughout the entire exercise so that you exercise the correct muscles. Sloppy form means that you are not strengthening the correct muscles.  Lie on your side so that your head, shoulders, knee, and hip line up. You may bend your lower knee to help maintain your balance. Your right / left leg should be on top.  Roll your hips slightly forward, so that your hips are stacked directly over each other and your right / left knee is facing forward.  Lift your top leg up 4-6 inches, leading with your heel. Be sure that your foot does not drift forward or that your knee does not roll toward the ceiling.  Hold this position for __________ seconds. You should feel the muscles in your outer hip lifting (you may not notice this until your leg begins to tire).  Slowly lower your leg to the starting position. Allow the muscles to fully relax before beginning the next repetition. Repeat __________ times. Complete this exercise __________ times per day.  STRENGTH - Hip Abductors, Quadruped  On a firm, lightly  padded surface, position yourself on your hands and knees. Your hands should be directly below your shoulders and your knees should be directly below your hips.  Keeping your right / left knee bent, lift your leg out to the side. Keep your legs level and in line with your shoulders.  Position yourself on your hands and knees.  Hold for __________ seconds.  Keeping your trunk steady and your hips level, slowly lower your leg to the starting position. Repeat __________ times. Complete this exercise __________ times per day.  STRENGTH - Hip Abductors, Standing  Tie one end of a rubber exercise band/tubing to a secure surface (table, pole) and tie a loop at the other end.  Place the loop around your right / left ankle. Keeping your ankle with the band directly opposite of the secured end, step away until there is tension in the tube/band.  Hold onto a chair as needed for balance.  Keeping your back upright, your shoulders over your hips, and your toes pointing forward, lift your right / left leg out to your side. Be sure to lift your leg with your hip muscles. Do not "throw" your leg or tip your body to lift your leg.  Slowly and with control, return to the starting position. Repeat exercise __________ times. Complete this exercise __________ times per day.  Document Released: 01/04/2005 Document Revised: 05/21/2013 Document Reviewed: 04/18/2008 [facility] Patient Information 2015 [place], [place]. This information is not intended to replace advice given to you by your health care provider. Make sure you discuss any questions you have with your health care provider.

## 2015-06-02 ENCOUNTER — Emergency Department (HOSPITAL_COMMUNITY)
Admission: EM | Admit: 2015-06-02 | Discharge: 2015-06-03 | Disposition: A | Discharge status: Home / Self Care | Payer: Medicaid Other | Attending: Emergency Medicine | Admitting: Emergency Medicine

## 2015-06-02 ENCOUNTER — Emergency Department (HOSPITAL_COMMUNITY): Payer: Medicaid Other

## 2015-06-02 ENCOUNTER — Encounter (HOSPITAL_COMMUNITY): Payer: Self-pay | Admitting: *Deleted

## 2015-06-02 DIAGNOSIS — Z79899 Other long term (current) drug therapy: Secondary | ICD-10-CM | POA: Diagnosis not present

## 2015-06-02 DIAGNOSIS — Z7901 Long term (current) use of anticoagulants: Secondary | ICD-10-CM | POA: Insufficient documentation

## 2015-06-02 DIAGNOSIS — Z8659 Personal history of other mental and behavioral disorders: Secondary | ICD-10-CM | POA: Diagnosis not present

## 2015-06-02 DIAGNOSIS — R079 Chest pain, unspecified: Secondary | ICD-10-CM | POA: Diagnosis present

## 2015-06-02 DIAGNOSIS — R0789 Other chest pain: Secondary | ICD-10-CM | POA: Diagnosis not present

## 2015-06-02 DIAGNOSIS — Z9119 Patient's noncompliance with other medical treatment and regimen: Secondary | ICD-10-CM | POA: Insufficient documentation

## 2015-06-02 DIAGNOSIS — Z87891 Personal history of nicotine dependence: Secondary | ICD-10-CM | POA: Insufficient documentation

## 2015-06-02 DIAGNOSIS — I10 Essential (primary) hypertension: Secondary | ICD-10-CM | POA: Diagnosis not present

## 2015-06-02 DIAGNOSIS — Z86718 Personal history of other venous thrombosis and embolism: Secondary | ICD-10-CM | POA: Insufficient documentation

## 2015-06-02 DIAGNOSIS — N63 Unspecified lump in breast: Secondary | ICD-10-CM | POA: Diagnosis not present

## 2015-06-02 DIAGNOSIS — N6310 Unspecified lump in the right breast, unspecified quadrant: Secondary | ICD-10-CM

## 2015-06-02 DIAGNOSIS — Z9114 Patient's other noncompliance with medication regimen: Secondary | ICD-10-CM

## 2015-06-02 LAB — CBC
HCT: 42 % (ref 36.0–46.0)
Hemoglobin: 13.5 g/dL (ref 12.0–15.0)
MCH: 25.6 pg — AB (ref 26.0–34.0)
MCHC: 32.1 g/dL (ref 30.0–36.0)
MCV: 79.5 fL (ref 78.0–100.0)
PLATELETS: 309 10*3/uL (ref 150–400)
RBC: 5.28 MIL/uL — AB (ref 3.87–5.11)
RDW: 13.6 % (ref 11.5–15.5)
WBC: 8.7 10*3/uL (ref 4.0–10.5)

## 2015-06-02 LAB — BASIC METABOLIC PANEL
Anion gap: 7 (ref 5–15)
BUN: 14 mg/dL (ref 6–20)
CHLORIDE: 106 mmol/L (ref 101–111)
CO2: 27 mmol/L (ref 22–32)
Calcium: 9.7 mg/dL (ref 8.9–10.3)
Creatinine, Ser: 0.84 mg/dL (ref 0.44–1.00)
GFR calc non Af Amer: >60 mL/min (ref >60)
GLUCOSE: 91 mg/dL (ref 65–99)
Potassium: 3.9 mmol/L (ref 3.5–5.1)
SODIUM: 140 mmol/L (ref 135–145)

## 2015-06-02 LAB — PROTIME-INR
INR: 1.02 (ref 0.00–1.49)
PROTHROMBIN TIME: 13.6 s (ref 11.6–15.2)

## 2015-06-02 LAB — I-STAT TROPONIN, ED: Troponin i, poc: 0 ng/mL (ref 0.00–0.08)

## 2015-06-02 MED ORDER — RIVAROXABAN (XARELTO) VTE STARTER PACK (15 & 20 MG): ORAL_TABLET | ORAL | Status: DC

## 2015-06-02 MED ORDER — ACETAMINOPHEN 325 MG PO TABS
650.0000 mg | ORAL_TABLET | Freq: Once | ORAL | Status: AC
  Administered 2015-06-02: 650 mg via ORAL
  Filled 2015-06-02: qty 2

## 2015-06-02 MED ORDER — RIVAROXABAN (XARELTO) EDUCATION KIT FOR DVT/PE PATIENTS
PACK | Freq: Once | Status: AC
  Administered 2015-06-02: 23:00:00
  Filled 2015-06-02: qty 1

## 2015-06-02 MED ORDER — IOPAMIDOL (ISOVUE-370) INJECTION 76%
INTRAVENOUS | Status: AC
  Administered 2015-06-02: 100 mL
  Filled 2015-06-02: qty 100

## 2015-06-02 MED ORDER — RIVAROXABAN 15 MG PO TABS
15.0000 mg | ORAL_TABLET | Freq: Once | ORAL | Status: AC
  Administered 2015-06-02: 15 mg via ORAL
  Filled 2015-06-02: qty 1

## 2015-06-02 NOTE — ED Notes (Signed)
C/o CP and leg pain, concerned for clot, h/o same, has not been taking coumadin or lovenox d/t cost, CP resolved, leg pain remains, alert, NAD, calm, interactive, resps e/u, speaking in complete sentences, no dyspnea noted, skin W&D. EDPA into room.

## 2015-06-02 NOTE — ED Notes (Signed)
Xarelto discussed with pt, pt verbalizes understanding.

## 2015-06-02 NOTE — ED Notes (Signed)
Lisa PA at bedside

## 2015-06-02 NOTE — ED Provider Notes (Signed)
CSN: [id number]     Arrival date & time 06/02/15  1735 History   First MD Initiated Contact with Patient 06/02/15 2012     Chief Complaint  Patient presents with  . Chest Pain     (Consider location/radiation/quality/duration/timing/severity/associated sxs/prior Treatment) Patient is a 30 y.o. female presenting with chest pain. The history is provided by the patient and medical records.  Chest Pain  30 year old female with history of DVT and PE, hypertension, PTSD, presenting to the ED for chest pain. Patient states today at work she began coughing and then had 1 episode of posttussive emesis. She states afterwards she developed some chest pain and shortness of breath. She states pain is like a "squeezing" sensation in the center of her chest. She states this has improved at this time, however has not completely resolved. She denies any shortness of breath currently. No reported fever or chills.  Patient does have history of multiple PEs in the past. She is supposed to be on Coumadin and Lovenox, however she has been off of this for approximately 3 weeks. She states she was not able to afford the visit to her physician to get a refill of her prescriptions.  She also has IVC filter in place.  Patient also reports some chronic leg pain. She states it is worse at night. She reports this is been ongoing since she had her total hysterectomy several years ago. Denies any numbness or weakness of her labs. No bowel or bladder changes.  No back pain.  VSS.  Past Medical History  Diagnosis Date  . History of blood clots   . Hypertension   . PTSD (post-traumatic stress disorder)    Past Surgical History  Procedure Laterality Date  . Abdominal hysterectomy     Family History  Problem Relation Age of Onset  . Hypertension Mother   . Hypertension Father    Social History  Substance Use Topics  . Smoking status: Former Games developer  . Smokeless tobacco: Never Used  . Alcohol Use: No   OB History    No data available     Review of Systems  Cardiovascular: Positive for chest pain.  All other systems reviewed and are negative.     Allergies  Review of patient's allergies indicates no known allergies.  Home Medications   Prior to Admission medications   Medication Sig Start Date End Date Taking? Authorizing Provider  citalopram (CELEXA) 20 MG tablet Take 20 mg by mouth daily.    Historical Provider, MD  haloperidol (HALDOL) 0.5 MG tablet Take 0.5 mg by mouth at bedtime.    Historical Provider, MD  HYDROcodone-acetaminophen (NORCO/VICODIN) 5-325 MG per tablet Take 2 tablets by mouth every 4 (four) hours as needed. 08/30/14   Danelle Berry, PA-C  predniSONE (DELTASONE) 20 MG tablet Take 2 tablets (40 mg total) by mouth daily. Take 40 mg by mouth daily for 3 days, then 20mg  by mouth daily for 3 days, then 10mg  daily for 3 days 08/30/14   Danelle Berry, PA-C  warfarin (COUMADIN) 2 MG tablet Take 2 mg by mouth daily.    Historical Provider, MD  XARELTO STARTER PACK 15 & 20 MG TBPK Take 15-20 mg by mouth as directed. Take as directed on package: Start with one 15mg  tablet by mouth twice a day with food. On Day 22, switch to one 20mg  tablet once a day with food. Patient not taking: Reported on 08/30/2014 08/09/14   Earley Favor, NP   BP 116/75 mmHg  Pulse  75  Temp(Src) 98.4 F (36.9 C) (Oral)  Resp 18  Ht 5\' 9"  (1.753 m)  Wt 95.074 kg  BMI 30.94 kg/m2  SpO2 100%  LMP 08/06/2010   Physical Exam  Constitutional: She is oriented to person, place, and time. She appears well-developed and well-nourished. No distress.  HENT:  Head: Normocephalic and atraumatic.  Mouth/Throat: Oropharynx is clear and moist.  Eyes: Conjunctivae and EOM are normal. Pupils are equal, round, and reactive to light.  Neck: Normal range of motion. Neck supple.  Cardiovascular: Normal rate, regular rhythm and normal heart sounds.   Pulmonary/Chest: Effort normal and breath sounds normal. No respiratory distress. She  has no wheezes.  Mild tenderness of left chest wall without deformity, no signs of distress, lungs overall clear  Abdominal: Soft. Bowel sounds are normal. There is no tenderness. There is no guarding.  Musculoskeletal: Normal range of motion.  No peripheral edema, calf swelling, redness, or tenderness  Neurological: She is alert and oriented to person, place, and time.  Skin: Skin is warm and dry. She is not diaphoretic.  Psychiatric: She has a normal mood and affect.  Nursing note and vitals reviewed.   ED Course  Procedures (including critical care time) Labs Review Labs Reviewed  CBC - Abnormal; Notable for the following:    RBC 5.28 (*)    MCH 25.6 (*)    All other components within normal limits  BASIC METABOLIC PANEL  PROTIME-INR  Rosezena SensorI-STAT TROPOININ, ED    Imaging Review Dg Chest 2 View  06/02/2015  CLINICAL DATA:  Chest pain.  Shortness of breath. EXAM: CHEST  2 VIEW COMPARISON:  08/30/2014 FINDINGS: IVC filter observed projecting over the abdomen. The lungs appear clear. Cardiac and mediastinal contours normal. No pleural effusion identified. IMPRESSION: No active cardiopulmonary disease. Electronically Signed   By: Gaylyn RongWalter  Liebkemann M.D.   On: 06/02/2015 18:56   Ct Angio Chest Pe W/cm &/or Wo Cm  06/02/2015  CLINICAL DATA:  Pt c/o CP, SOB. States pain is radiating to legs and has been ongoing for several years. Pt states her coumadin had run out so has not taken any for sometime now. EXAM: CT ANGIOGRAPHY CHEST WITH CONTRAST TECHNIQUE: Multidetector CT imaging of the chest was performed using the standard protocol during bolus administration of intravenous contrast. Multiplanar CT image reconstructions and MIPs were obtained to evaluate the vascular anatomy. CONTRAST:  80 mL Isovue COMPARISON:  CT 08/30/2014 FINDINGS: Mediastinum/Nodes: No axillary lymphadenopathy. Again demonstrated a 3.2 cm mass in the medial RIGHT breast. No mediastinal or hilar lymphadenopathy.  No  pericardial fluid. No filling defects within the pulmonary arteries to suggest acute pulmonary embolism. Aorta poorly opacified. Lungs/Pleura: No pulmonary infarction. No pleural fluid or edema. No pneumonia. Upper abdomen: Limited view of the liver, kidneys, pancreas are unremarkable. Normal adrenal glands. Musculoskeletal: No aggressive osseous lesion. Review of the MIP images confirms the above findings. IMPRESSION: 1. No evidence acute pulmonary embolism. 2. No acute pulmonary parenchymal abnormalities. 3. Large RIGHT breast mass again demonstrated. Ultrasound was recommended for evaluation of this lesion on CT of 08/30/2014. In the absence of documented workup in the EMR, recommend diagnostic ultrasound of the RIGHT breast. Electronically Signed   By: Genevive BiStewart  Edmunds M.D.   On: 06/02/2015 22:04   I have personally reviewed and evaluated these images and lab results as part of my medical decision-making.   EKG Interpretation None      MDM   Final diagnoses:  Chest pain, unspecified chest pain type  Non compliance w medication regimen  Breast mass, right   30 year old female here with chest pain. She has a history of DVT and PE, has been noncompliant with her Coumadin. Patient is afebrile, nontoxic. Her vital signs are stable. Her EKG is sinus rhythm without acute ischemic changes. Initial lab work including troponin is reassuring. Her chest x-ray is clear. Given patient's history, diagnosis of PE must be considered. CT angios of chest obtained, no evidence of PE. Patient again has noted right breast mass on CT. This has been known to patient for several months, however she is not yet followed up. I had a long discussion with patient regarding medication compliance. She states she has had difficulty with the Coumadin due to frequent INR checks. Feel she may benefit from changing to Xarelto, it appears she took this in the past and tolerated it well. Case management has reviewed use of Xarelto and  trial pack with her in the ED, she acknowledged understanding. I have ordered an outpatient right breast ultrasound for further evaluation of the mass seen on CT as well. I have encouraged her to follow up with her primary care physician regarding both of these concerns discussed during this ED visit.  Discussed plan with patient, she acknowledged understanding and agreed with plan of care.  Return precautions given for new or worsening symptoms.  Garlon Hatchet, PA-C 06/03/15 0011  Benjiman Core, MD 06/04/15 [phone]

## 2015-06-02 NOTE — ED Notes (Signed)
Pt states that she has been having pain all over  With chest pain and SOB. Pt states that there pain is worse in her legs. Pt states that this pain has been ongoing for several years. Pt reports hx of PEs. Pt states that she had a coughing episode this morning and vomitted x1. Pt states that she is out of her coumadin as well.

## 2015-06-02 NOTE — Discharge Instructions (Signed)
Take the prescribed medication as directed.  It is important you take this medication as directed to prevent further blood clots. Follow-up with the breast center to have ultrasound of right breast performed.  Please do this in a timely manner. Follow-up with your primary care doctor. Return to the ED for new or worsening symptoms.

## 2015-06-02 NOTE — ED Notes (Signed)
Patient transported to CT 

## 2015-07-24 ENCOUNTER — Other Ambulatory Visit: Payer: Self-pay | Admitting: General Practice

## 2015-07-24 ENCOUNTER — Emergency Department (HOSPITAL_COMMUNITY)
Admission: EM | Admit: 2015-07-24 | Discharge: 2015-07-24 | Disposition: A | Discharge status: Home / Self Care | Payer: Medicaid Other | Attending: Emergency Medicine | Admitting: Emergency Medicine

## 2015-07-24 ENCOUNTER — Encounter (HOSPITAL_COMMUNITY): Payer: Self-pay

## 2015-07-24 DIAGNOSIS — Z87891 Personal history of nicotine dependence: Secondary | ICD-10-CM | POA: Insufficient documentation

## 2015-07-24 DIAGNOSIS — N6310 Unspecified lump in the right breast, unspecified quadrant: Secondary | ICD-10-CM

## 2015-07-24 DIAGNOSIS — Z7901 Long term (current) use of anticoagulants: Secondary | ICD-10-CM | POA: Insufficient documentation

## 2015-07-24 DIAGNOSIS — I10 Essential (primary) hypertension: Secondary | ICD-10-CM | POA: Insufficient documentation

## 2015-07-24 DIAGNOSIS — N644 Mastodynia: Secondary | ICD-10-CM | POA: Insufficient documentation

## 2015-07-24 NOTE — Discharge Instructions (Signed)
Please follow with your primary care doctor in the next 2 days for a check-up. They must obtain records for further management.   Do not hesitate to return to the Emergency Department for any new, worsening or concerning symptoms.    Breast Tenderness Breast tenderness is a common problem for women of all ages. Breast tenderness may cause mild discomfort to severe pain. It has a variety of causes. Your health care provider will find out the likely cause of your breast tenderness by examining your breasts, asking you about symptoms, and ordering some tests. Breast tenderness usually does not mean you have breast cancer. HOME CARE INSTRUCTIONS  Breast tenderness often can be handled at home. You can try:  Getting fitted for a new bra that provides more support, especially during exercise.  Wearing a more supportive bra or sports bra while sleeping when your breasts are very tender.  If you have a breast injury, apply ice to the area:  Put ice in a plastic bag.  Place a towel between your skin and the bag.  Leave the ice on for 20 minutes, 2-3 times a day.  If your breasts are too full of milk as a result of breastfeeding, try:  Expressing milk either by hand or with a breast pump.  Applying a warm compress to the breasts for relief.  Taking over-the-counter pain relievers, if approved by your health care provider.  Taking other medicines that your health care provider prescribes. These may include antibiotic medicines or birth control pills. Over the long term, your breast tenderness might be eased if you:  Cut down on caffeine.  Reduce the amount of fat in your diet. Keep a log of the days and times when your breasts are most tender. This will help you and your health care provider find the cause of the tenderness and how to relieve it. Also, learn how to do breast exams at home. This will help you notice if you have an unusual growth or lump that could cause tenderness. SEEK  MEDICAL CARE IF:   Any part of your breast is hard, red, and hot to the touch. This could be a sign of infection.  Fluid is coming out of your nipples (and you are not breastfeeding). Especially watch for blood or pus.  You have a fever as well as breast tenderness.  You have a new or painful lump in your breast that remains after your menstrual period ends.  You have tried to take care of the pain at home, but it has not gone away.  Your breast pain is getting worse, or the pain is making it hard to do the things you usually do during your day.   This information is not intended to replace advice given to you by your health care provider. Make sure you discuss any questions you have with your health care provider.   Document Released: 12/18/2007 Document Revised: 09/06/2012 Document Reviewed: 08/03/2012 Elsevier Interactive Patient Education [employer]2016 Elsevier Inc.

## 2015-07-24 NOTE — ED Notes (Signed)
Pt presents with 1 month h/o bilateral breast pain. Pt reports cysts around her nipples, denies any drainage from either site.  Pt reports pain to lower back that started after hysterectomy (2007) reports pain from lower back radiates into L leg.  Pt reports tingling into both hands and feet.  Pt denies any bladder or bowel incontinence.  Pt reports h/o DVT to R ankle, is on xarelto.

## 2015-07-24 NOTE — ED Provider Notes (Signed)
CSN: [id number]     Arrival date & time 07/24/15  1045 History   First MD Initiated Contact with Patient 07/24/15 1144     No chief complaint on file.    (Consider location/radiation/quality/duration/timing/severity/associated sxs/prior Treatment) HPI  Blood pressure 117/70, pulse 75, temperature 98.8 F (37.1 C), temperature source Oral, resp. rate 20, height 5\' 9"  (1.753 m), weight 95.255 kg, last menstrual period 08/06/2010, SpO2 100 %.  Betty Harris is a 30 y.o. female complaining of breast pain x1 month.  She describes feeling cysts in both breasts, and states generalized pain began in right breast 4 weeks ago but soon after became bilateral.  Her discomfort is exacerbated by pressure and claims she is no longer able to wear a bra.  She denies discharge or visible breast changes.   She also complains of lower back and left hip pain since 2007 with intermittent tingling in hands and feet.  She states that staying in one position too long exacerbates her back pain, which then "shoots down my left hip and leg."  She denies prior back injury.  She has been taking tramadol for her back pain, but has recently "run out."  Of note, the patient has a history of bilateral salpingectomy/hysterectomy in 2007, as well as history of DVT.   Past Medical History  Diagnosis Date  . History of blood clots   . Hypertension   . PTSD (post-traumatic stress disorder)    Past Surgical History  Procedure Laterality Date  . Abdominal hysterectomy     Family History  Problem Relation Age of Onset  . Hypertension Mother   . Hypertension Father    Social History  Substance Use Topics  . Smoking status: Former Games developermoker  . Smokeless tobacco: Never Used  . Alcohol Use: No   OB History    No data available     Review of Systems  10 systems reviewed and found to be negative, except as noted in the HPI.   Allergies  Review of patient's allergies indicates no known allergies.  Home Medications    Prior to Admission medications   Medication Sig Start Date End Date Taking? Authorizing Provider  HYDROcodone-acetaminophen (NORCO/VICODIN) 5-325 MG per tablet Take 2 tablets by mouth every 4 (four) hours as needed. 08/30/14   Danelle BerryLeisa Tapia, PA-C  predniSONE (DELTASONE) 20 MG tablet Take 2 tablets (40 mg total) by mouth daily. Take 40 mg by mouth daily for 3 days, then 20mg  by mouth daily for 3 days, then 10mg  daily for 3 days 08/30/14   Danelle BerryLeisa Tapia, PA-C  Rivaroxaban 15 & 20 MG TBPK Take as directed on package: Start with one 15mg  tablet by mouth twice a day with food. On Day 22, switch to one 20mg  tablet once a day with food. 06/02/15   Garlon HatchetLisa M Sanders, PA-C  warfarin (COUMADIN) 2 MG tablet Take 2 mg by mouth daily. Reported on 06/02/2015    Historical Provider, MD   BP 127/77 mmHg  Pulse 90  Temp(Src) 98.8 F (37.1 C) (Oral)  Resp 20  Ht 5\' 9"  (1.753 m)  Wt 95.255 kg  BMI 31.00 kg/m2  SpO2 98%  LMP 08/06/2010 Physical Exam  Constitutional: She is oriented to person, place, and time. She appears well-developed and well-nourished. No distress.  HENT:  Head: Normocephalic and atraumatic.  Mouth/Throat: Oropharynx is clear and moist.  Eyes: Conjunctivae and EOM are normal. Pupils are equal, round, and reactive to light.  Neck: Normal range of motion.  Cardiovascular:  Normal rate, regular rhythm and intact distal pulses.   Pulmonary/Chest: Effort normal and breath sounds normal. No stridor.  Abdominal: Soft. There is no tenderness.  Musculoskeletal: Normal range of motion.  Neurological: She is alert and oriented to person, place, and time.  Skin: She is not diaphoretic.  Psychiatric: She has a normal mood and affect.  Nursing note and vitals reviewed.   ED Course  Procedures (including critical care time) Labs Review Labs Reviewed - No data to display  Imaging Review No results found. I have personally reviewed and evaluated these images and lab results as part of my medical  decision-making.   EKG Interpretation None      MDM   Final diagnoses:  Breast pain    Filed Vitals:   07/24/15 1048 07/24/15 1215  BP: 127/77 117/70  Pulse: 90 75  Temp: 98.8 F (37.1 C)   TempSrc: Oral   Resp: 20   Height: 5\' 9"  (1.753 m)   Weight: 95.255 kg   SpO2: 98% 100%    Betty Harris is 30 y.o. female presenting with 1 month of bilateral breast pain with no discharge. Low back pain which started in 2007 status post hysterectomy radiates into the right leg. Patient has tingling to bilateral hands and feet. She's been compliant with her Xarelto for DVT/PE. No abnormality on breast exam. Neurologic exam is nonfocal, patient afebrile with stable vital signs, overall well-appearing. Patient is very frustrated, has had these pains for an extended period of time and would like an answer today, explained to her that we may not come to a conclusion that we are not here to diagnose all complaints but rather to evaluate for life threats, patient is dissatisfied with plan of emergent evaluation and wishes to leave the ED.  [facility]       Karlen Barbar, PA-C 07/24/15 1305  Zadie Rhineonald Wickline, MD 07/24/15 1540

## 2015-07-30 ENCOUNTER — Other Ambulatory Visit: Payer: Medicaid Other

## 2015-07-31 ENCOUNTER — Other Ambulatory Visit: Payer: Medicaid Other

## 2015-08-14 ENCOUNTER — Ambulatory Visit: Payer: Medicaid Other | Admitting: Family Medicine

## 2016-03-22 ENCOUNTER — Emergency Department (HOSPITAL_COMMUNITY)
Admission: EM | Admit: 2016-03-22 | Discharge: 2016-03-22 | Disposition: A | Discharge status: Home / Self Care | Payer: Medicaid Other | Attending: Emergency Medicine | Admitting: Emergency Medicine

## 2016-03-22 ENCOUNTER — Encounter (HOSPITAL_COMMUNITY): Payer: Self-pay | Admitting: *Deleted

## 2016-03-22 DIAGNOSIS — I10 Essential (primary) hypertension: Secondary | ICD-10-CM | POA: Insufficient documentation

## 2016-03-22 DIAGNOSIS — K0889 Other specified disorders of teeth and supporting structures: Secondary | ICD-10-CM

## 2016-03-22 DIAGNOSIS — Z87891 Personal history of nicotine dependence: Secondary | ICD-10-CM | POA: Insufficient documentation

## 2016-03-22 DIAGNOSIS — Z7901 Long term (current) use of anticoagulants: Secondary | ICD-10-CM | POA: Insufficient documentation

## 2016-03-22 DIAGNOSIS — Z79899 Other long term (current) drug therapy: Secondary | ICD-10-CM | POA: Insufficient documentation

## 2016-03-22 MED ORDER — HYDROCODONE-ACETAMINOPHEN 5-325 MG PO TABS: 1.0000 | ORAL_TABLET | Freq: Four times a day (QID) | ORAL | 0 refills | Status: DC | PRN

## 2016-03-22 MED ORDER — OXYCODONE-ACETAMINOPHEN 5-325 MG PO TABS
1.0000 | ORAL_TABLET | Freq: Once | ORAL | Status: AC
  Administered 2016-03-22: 1 via ORAL
  Filled 2016-03-22: qty 1

## 2016-03-22 MED ORDER — PENICILLIN V POTASSIUM 500 MG PO TABS: 500.0000 mg | ORAL_TABLET | Freq: Four times a day (QID) | ORAL | 0 refills | Status: AC

## 2016-03-22 MED ORDER — KETOROLAC TROMETHAMINE 30 MG/ML IJ SOLN
30.0000 mg | Freq: Once | INTRAMUSCULAR | Status: AC
  Administered 2016-03-22: 30 mg via INTRAMUSCULAR
  Filled 2016-03-22: qty 1

## 2016-03-22 NOTE — ED Provider Notes (Signed)
[facility]-EMERGENCY DEPT Provider Note   CSN: [id number] Arrival date & time: 03/22/16  0206  By signing my name below, I, Teofilo Pod, attest that this documentation has been prepared under the direction and in the presence of Shon Baton, MD . Electronically Signed: Teofilo Pod, ED Scribe. 03/22/2016. 2:26 AM.    History   Chief Complaint Chief Complaint  Patient presents with  . Dental Pain    The history is provided by the patient. No language interpreter was used.   HPI Comments:  Betty Harris is a 31 y.o. female who presents to the Emergency Department complaining of constant, worsening upper left dental pain x 2 weeks. Pt denies any recent dental procedures, and does not have a dentist at this time. She rates the pain at 10/10. Pt reports a recent hysterectomy and takes coumadin for blood clots. Pt has been using oragel, Aleve, ibuprofen, and tylenol with no relief. Pt is a smoker. Pt denies other associated symptoms.   Past Medical History:  Diagnosis Date  . History of blood clots   . Hypertension   . PTSD (post-traumatic stress disorder)     There are no active problems to display for this patient.   Past Surgical History:  Procedure Laterality Date  . ABDOMINAL HYSTERECTOMY      OB History    No data available       Home Medications    Prior to Admission medications   Medication Sig Start Date End Date Taking? Authorizing Provider  HYDROcodone-acetaminophen (NORCO/VICODIN) 5-325 MG tablet Take 1-2 tablets by mouth every 6 (six) hours as needed. 03/22/16   Shon Baton, MD  penicillin v potassium (VEETID) 500 MG tablet Take 1 tablet (500 mg total) by mouth 4 (four) times daily. 03/22/16 03/29/16  Shon Baton, MD  predniSONE (DELTASONE) 20 MG tablet Take 2 tablets (40 mg total) by mouth daily. Take 40 mg by mouth daily for 3 days, then 20mg  by mouth daily for 3 days, then 10mg  daily for 3 days 08/30/14   Danelle Berry, PA-C  Rivaroxaban  15 & 20 MG TBPK Take as directed on package: Start with one 15mg  tablet by mouth twice a day with food. On Day 22, switch to one 20mg  tablet once a day with food. 06/02/15   Garlon Hatchet, PA-C  warfarin (COUMADIN) 2 MG tablet Take 2 mg by mouth daily. Reported on 06/02/2015    Historical Provider, MD    Family History Family History  Problem Relation Age of Onset  . Hypertension Mother   . Hypertension Father     Social History Social History  Substance Use Topics  . Smoking status: Former Games developer  . Smokeless tobacco: Never Used  . Alcohol use No     Allergies   Patient has no known allergies.   Review of Systems Review of Systems  Constitutional: Negative for fever.  HENT: Positive for dental problem. Negative for sore throat and trouble swallowing.   Skin: Negative for color change.  All other systems reviewed and are negative.    Physical Exam Updated Vital Signs BP 141/87   Pulse 82   Temp 98.7 F (37.1 C) (Oral)   Resp 16   Ht 5\' 9"  (1.753 m)   Wt 215 lb (97.5 kg)   LMP 08/06/2010   SpO2 100%   BMI 31.75 kg/m   Physical Exam  Constitutional: She is oriented to person, place, and time. She appears well-developed and well-nourished. No  distress.  HENT:  Head: Normocephalic and atraumatic.  Discolored gums with retraction noted over the left upper premolar, no palpable abscess, no obvious caries, no fullness noted under the tongue  Cardiovascular: Normal rate and regular rhythm.   No murmur heard. Pulmonary/Chest: Effort normal and breath sounds normal. No respiratory distress.  Neurological: She is alert and oriented to person, place, and time.  Skin: Skin is warm and dry.  Psychiatric: She has a normal mood and affect.  Nursing note and vitals reviewed.    ED Treatments / Results  DIAGNOSTIC STUDIES:  Oxygen Saturation is 100% on RA, normal by my interpretation.    COORDINATION OF CARE:  2:25 AM Discussed treatment plan with pt at bedside and  pt agreed to plan.   Labs (all labs ordered are listed, but only abnormal results are displayed) Labs Reviewed - No data to display  EKG  EKG Interpretation None       Radiology No results found.  Procedures Procedures (including critical care time)  Medications Ordered in ED Medications  ketorolac (TORADOL) 30 MG/ML injection 30 mg (not administered)  oxyCODONE-acetaminophen (PERCOCET/ROXICET) 5-325 MG per tablet 1 tablet (not administered)     Initial Impression / Assessment and Plan / ED Course  I have reviewed the triage vital signs and the nursing notes.  Pertinent labs & imaging results that were available during my care of the patient were reviewed by me and considered in my medical decision making (see chart for details).     Agent presents with uncomplicated dental pain. No signs or symptoms of abscess. Likely underlying infection. She is a smoker and has obvious periodontal disease.  She is on Coumadin. For this reason, she will be given a short course of Norco as she should limit NSAIDs. She will be given penicillin and dental resource follow-up.  After history, exam, and medical workup I feel the patient has been appropriately medically screened and is safe for discharge home. Pertinent diagnoses were discussed with the patient. Patient was given return precautions.   Final Clinical Impressions(s) / ED Diagnoses   Final diagnoses:  Pain, dental    New Prescriptions New Prescriptions   HYDROCODONE-ACETAMINOPHEN (NORCO/VICODIN) 5-325 MG TABLET    Take 1-2 tablets by mouth every 6 (six) hours as needed.   PENICILLIN V POTASSIUM (VEETID) 500 MG TABLET    Take 1 tablet (500 mg total) by mouth 4 (four) times daily.   I personally performed the services described in this documentation, which was scribed in my presence. The recorded information has been reviewed and is accurate.     Shon Batonourtney F Horton, MD 03/22/16 [phone]

## 2016-03-22 NOTE — ED Notes (Signed)
Bed: WTR8 Expected date:  Expected time:  Means of arrival:  Comments: 

## 2016-03-22 NOTE — ED Triage Notes (Signed)
C/o upper left dental pain for several weeks, worse tonight. Last aleve around 2100 for pain.

## 2016-04-25 ENCOUNTER — Encounter (HOSPITAL_COMMUNITY): Payer: Self-pay | Admitting: Emergency Medicine

## 2016-04-25 ENCOUNTER — Emergency Department (HOSPITAL_COMMUNITY)
Admission: EM | Admit: 2016-04-25 | Discharge: 2016-04-25 | Disposition: A | Discharge status: Home / Self Care | Payer: Medicaid Other | Attending: Emergency Medicine | Admitting: Emergency Medicine

## 2016-04-25 ENCOUNTER — Emergency Department (HOSPITAL_BASED_OUTPATIENT_CLINIC_OR_DEPARTMENT_OTHER)
Admit: 2016-04-25 | Discharge: 2016-04-25 | Disposition: A | Discharge status: Home / Self Care | Payer: Self-pay | Attending: Emergency Medicine | Admitting: Emergency Medicine

## 2016-04-25 DIAGNOSIS — R200 Anesthesia of skin: Secondary | ICD-10-CM | POA: Insufficient documentation

## 2016-04-25 DIAGNOSIS — I10 Essential (primary) hypertension: Secondary | ICD-10-CM | POA: Insufficient documentation

## 2016-04-25 DIAGNOSIS — M79609 Pain in unspecified limb: Secondary | ICD-10-CM

## 2016-04-25 DIAGNOSIS — R202 Paresthesia of skin: Secondary | ICD-10-CM

## 2016-04-25 DIAGNOSIS — Z7901 Long term (current) use of anticoagulants: Secondary | ICD-10-CM | POA: Insufficient documentation

## 2016-04-25 DIAGNOSIS — M79661 Pain in right lower leg: Secondary | ICD-10-CM | POA: Insufficient documentation

## 2016-04-25 DIAGNOSIS — Z87891 Personal history of nicotine dependence: Secondary | ICD-10-CM | POA: Insufficient documentation

## 2016-04-25 DIAGNOSIS — M79604 Pain in right leg: Secondary | ICD-10-CM

## 2016-04-25 MED ORDER — RIVAROXABAN (XARELTO) VTE STARTER PACK (15 & 20 MG): ORAL_TABLET | ORAL | 0 refills | Status: DC

## 2016-04-25 NOTE — ED Notes (Signed)
Pt states she has hx of hypertension but unable to take bp meds due to insurance. Has not taken meds since 4 weeks ago.

## 2016-04-25 NOTE — Progress Notes (Signed)
*  PRELIMINARY RESULTS* Vascular Ultrasound Right lower extremity venous duplex has been completed.  Preliminary findings: No evidence of DVT or baker's cyst.  Farrel Demark, RDMS, RVT  04/25/2016, 10:40 AM

## 2016-04-25 NOTE — ED Triage Notes (Addendum)
Pt c/o numbness / tingling and pain to R foot, bilateral upper extremities. Pt states she stands all day at work and has had worsening pain and swelling to R lower extremity. Pt states hands "lock up" and has taken OTC pain meds that relieve some of the pain. Pt's RLE painful to touch.

## 2016-04-26 NOTE — ED Provider Notes (Signed)
[facility]-EMERGENCY DEPT Provider Note   CSN: [id number] Arrival date & time: 04/25/16  1478     History   Chief Complaint Chief Complaint  Patient presents with  . Foot Pain  . Numbness    all extremities    HPI Betty Harris is a 31 y.o. female.  The history is provided by the patient.  Foot Pain  This is a new problem. The current episode started 12 to 24 hours ago (of right lower leg pain and swelling). The problem occurs constantly. The problem has not changed since onset.Pertinent negatives include no chest pain, no abdominal pain and no shortness of breath. Nothing aggravates the symptoms. Nothing relieves the symptoms. She has tried nothing for the symptoms.    Past Medical History:  Diagnosis Date  . History of blood clots   . Hypertension   . PTSD (post-traumatic stress disorder)     There are no active problems to display for this patient.   Past Surgical History:  Procedure Laterality Date  . ABDOMINAL HYSTERECTOMY    . VENA CAVA FILTER PLACEMENT      OB History    No data available       Home Medications    Prior to Admission medications   Medication Sig Start Date End Date Taking? Authorizing Provider  acetaminophen (TYLENOL) 325 MG tablet Take 650 mg by mouth every 6 (six) hours as needed.   Yes Historical Provider, MD  Rivaroxaban 15 & 20 MG TBPK Take as directed on package: Start with one  tablet by mouth twice a day with food. On Day 22, switch to one  tablet once a day with food. 04/25/16   Lyndal Pulley, MD    Family History Family History  Problem Relation Age of Onset  . Hypertension Mother   . Hypertension Father     Social History Social History  Substance Use Topics  . Smoking status: Former Games developer  . Smokeless tobacco: Never Used  . Alcohol use No     Allergies   Patient has no known allergies.   Review of Systems Review of Systems  Respiratory: Negative for shortness of breath.   Cardiovascular: Negative for  chest pain.  Gastrointestinal: Negative for abdominal pain.  All other systems reviewed and are negative.    Physical Exam Updated Vital Signs BP 112/88 (BP Location: Right Arm)   Pulse 88   Temp 98.2 F (36.8 C) (Oral)   Resp 16   LMP 08/06/2010   SpO2 98%   Physical Exam  Constitutional: She is oriented to person, place, and time. She appears well-developed and well-nourished. No distress.  HENT:  Head: Normocephalic.  Nose: Nose normal.  Eyes: Conjunctivae are normal.  Neck: Neck supple. No tracheal deviation present.  Cardiovascular: Normal rate and regular rhythm.   Pulmonary/Chest: Effort normal. No respiratory distress.  Abdominal: Soft. She exhibits no distension.  Musculoskeletal:  No significant swelling, pain and tenderness of right calf  Neurological: She is alert and oriented to person, place, and time. She has normal strength. No cranial nerve deficit or sensory deficit. Coordination normal.  Skin: Skin is warm and dry.  Psychiatric: She has a normal mood and affect.     ED Treatments / Results  Labs (all labs ordered are listed, but only abnormal results are displayed) Labs Reviewed - No data to display  EKG  EKG Interpretation None       Radiology No results found.  Procedures Procedures (including critical care time)  Medications Ordered in ED Medications - No data to display   Initial Impression / Assessment and Plan / ED Course  I have reviewed the triage vital signs and the nursing notes.  Pertinent labs & imaging results that were available during my care of the patient were reviewed by me and considered in my medical decision making (see chart for details).     31 y.o. female presents with right leg pain and feeling like she couldn't get her foot in her shoe today. She has h/o DVT with IVC filter in place and is noncompliant with xarelto. Has secondary complaint of occasional diffuse paresthesias but no active symptoms. xarelto  refilled here. No DVT on doppler. Patient needs to establish primary care in the area and was provided contact information to do so. Return precautions discussed for worsening or new concerning symptoms.   Final Clinical Impressions(s) / ED Diagnoses   Final diagnoses:  Pain of right lower extremity  Paresthesia    New Prescriptions Discharge Medication List as of 04/25/2016 12:47 PM       Lyndal Pulley, MD 04/26/16 1512

## 2016-08-15 IMAGING — CT CT ANGIO CHEST
2 of 9 series · 19 of 46 positions shown · IV contrast (isovue)
Comparison: CT 08/30/2014

CLINICAL DATA: Pt c/o CP, SOB. States pain is radiating to legs and
has been ongoing for several years. Pt states her coumadin had run
out so has not taken any for sometime now.

EXAM:
CT ANGIOGRAPHY CHEST WITH CONTRAST
TECHNIQUE: Multidetector CT imaging of the chest was performed using the
standard protocol during bolus administration of intravenous
contrast. Multiplanar CT image reconstructions and MIPs were
obtained to evaluate the vascular anatomy.
CONTRAST:  80 mL Isovue

[Series 5: thins · axial · 0.64mm/px · z∈[+1238,+1485]mm · 16 of 279 slices shown]
[im 16/279  lung]
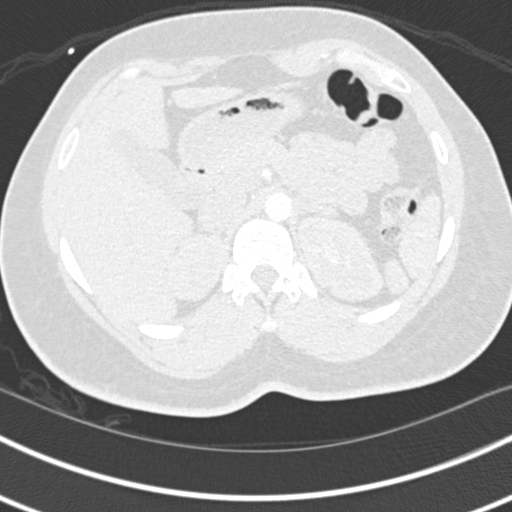
[im 31/279  soft-tissue]
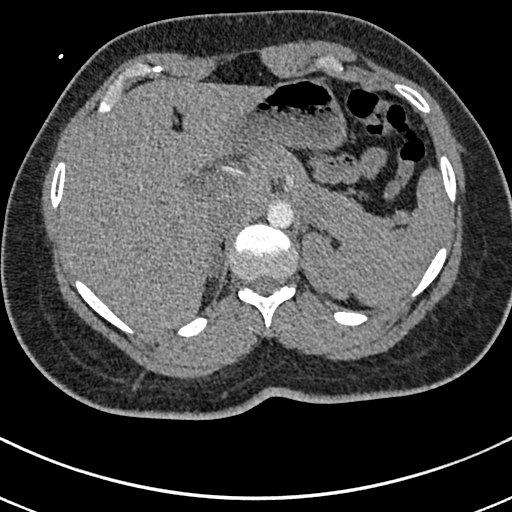
[im 47/279  lung]
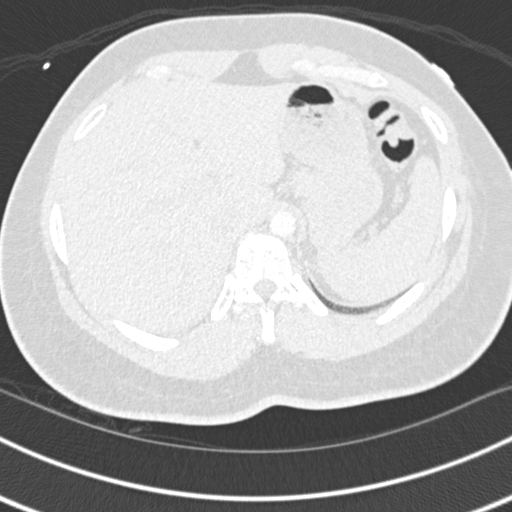
[im 62/279  soft-tissue]
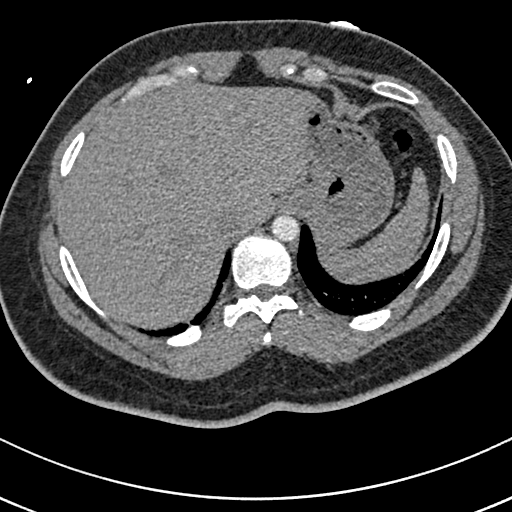
[im 78/279  lung]
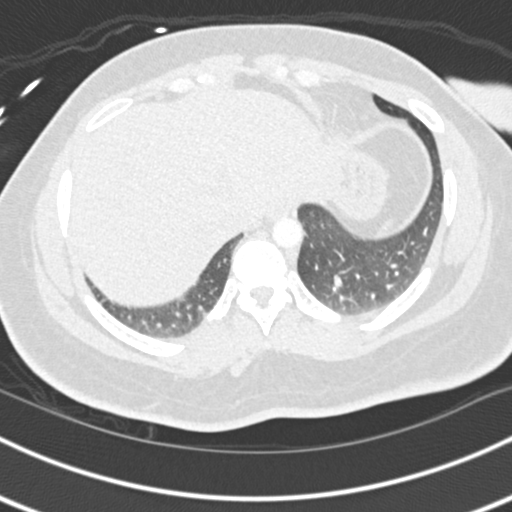
[im 93/279  soft-tissue]
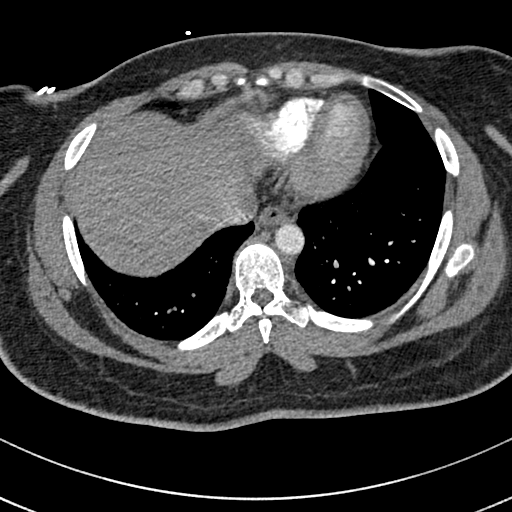
[im 109/279  lung]
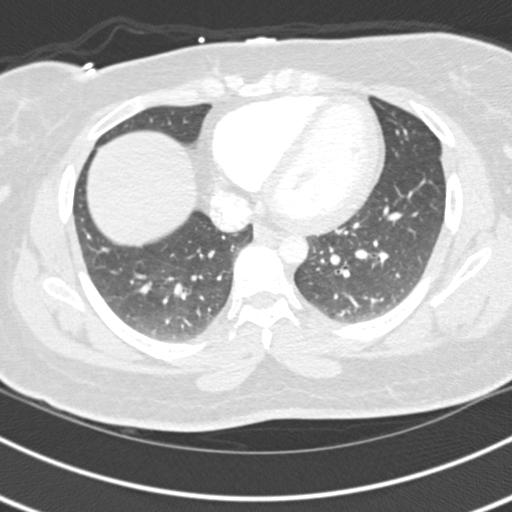
[im 124/279  soft-tissue]
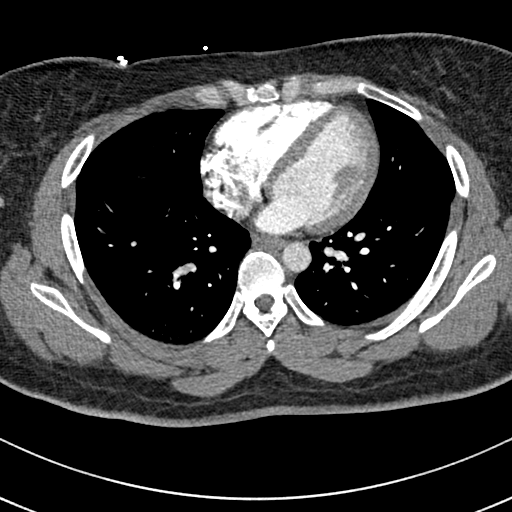
[im 155/279  lung]
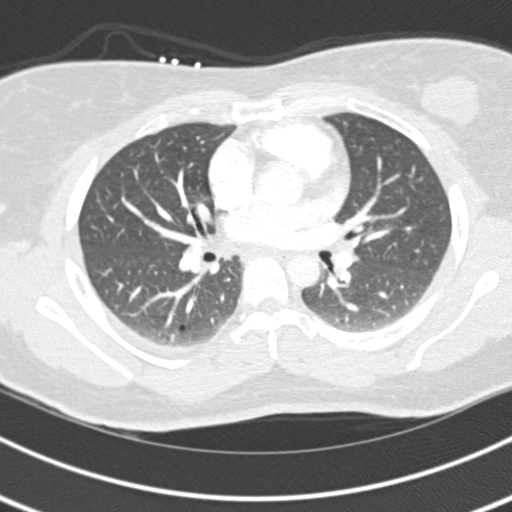
[im 170/279  soft-tissue]
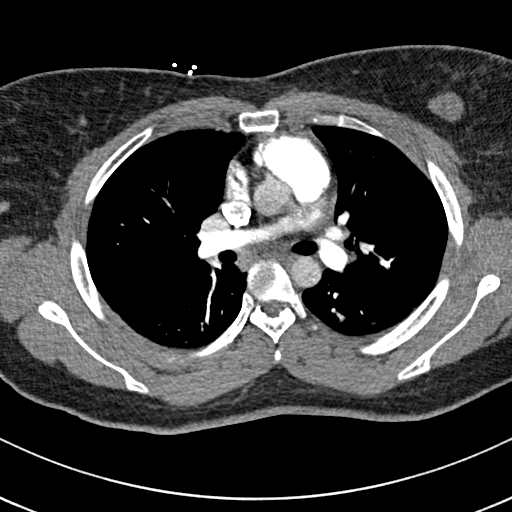
[im 186/279  lung]
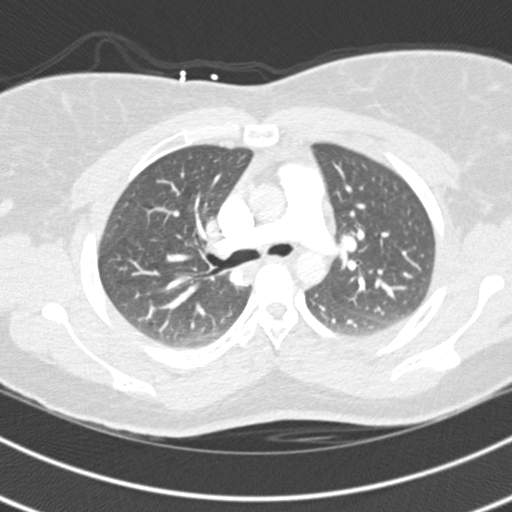
[im 201/279  soft-tissue]
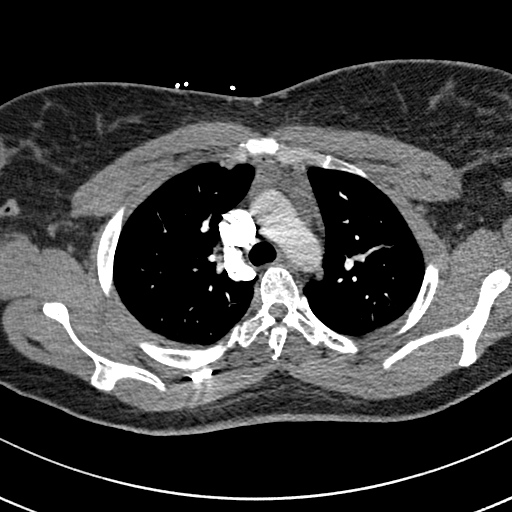
[im 217/279  lung]
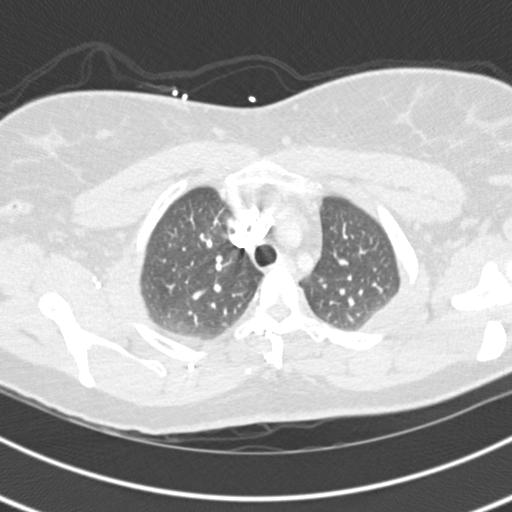
[im 232/279  soft-tissue]
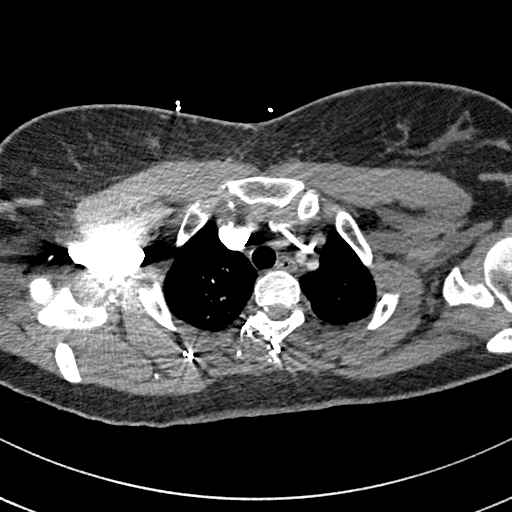
[im 248/279  lung]
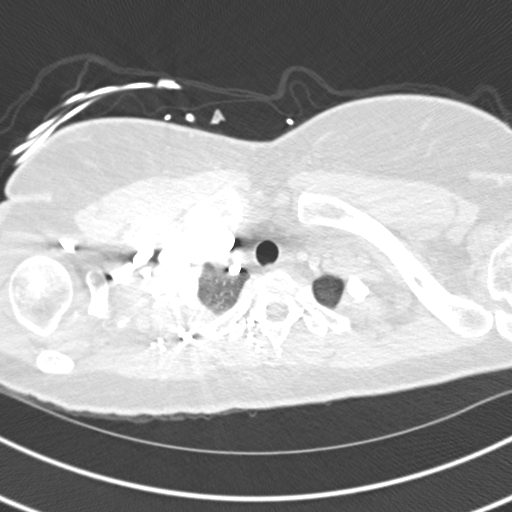
[im 263/279  soft-tissue]
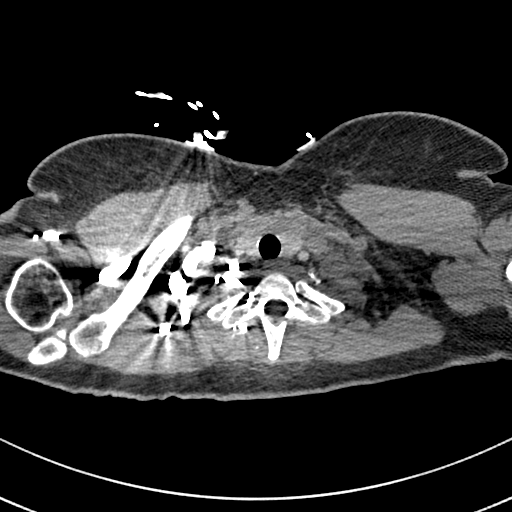

[Series 7: coronal mpr · coronal · 0.57mm/px · 3 of 110 slices shown]
[im 28/110  soft-tissue]
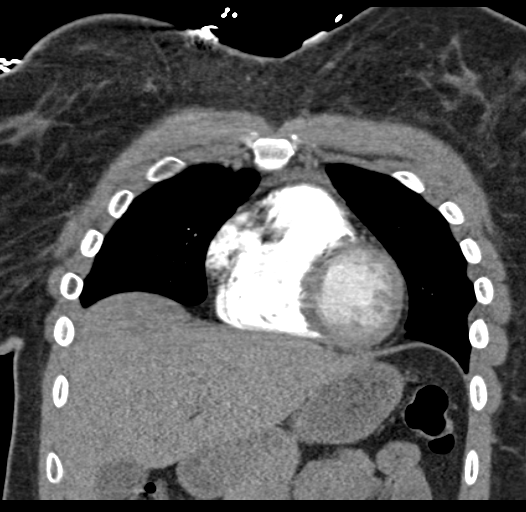
[im 55/110  soft-tissue]
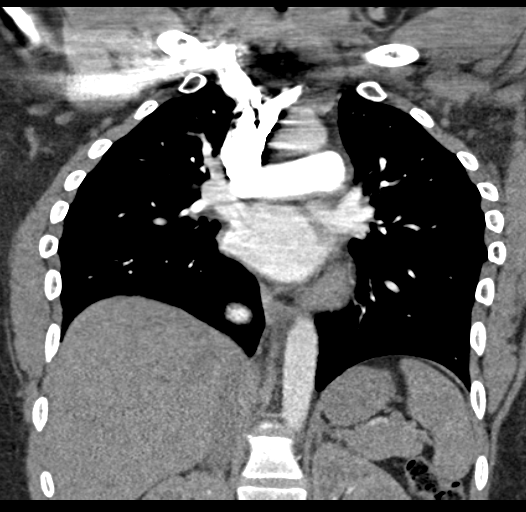
[im 82/110  soft-tissue]
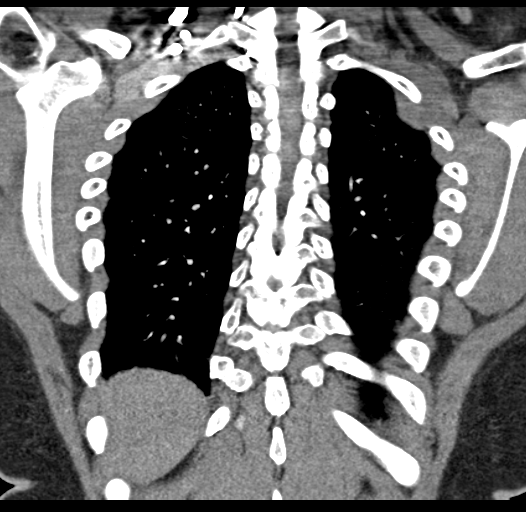

[19 of 46 positions shown; findings below may reference images not displayed]

FINDINGS: Mediastinum/Nodes: No axillary lymphadenopathy. Again demonstrated a
3.2 cm mass in the medial RIGHT breast.

No mediastinal or hilar lymphadenopathy.  No pericardial fluid.

No filling defects within the pulmonary arteries to suggest acute
pulmonary embolism. Aorta poorly opacified.

Lungs/Pleura: No pulmonary infarction. No pleural fluid or edema. No
pneumonia.

Upper abdomen: Limited view of the liver, kidneys, pancreas are
unremarkable. Normal adrenal glands.

Musculoskeletal: No aggressive osseous lesion.

Review of the MIP images confirms the above findings.
IMPRESSION: 1. No evidence acute pulmonary embolism.
2. No acute pulmonary parenchymal abnormalities.
3. Large RIGHT breast mass again demonstrated. Ultrasound was
recommended for evaluation of this lesion on CT of 08/30/2014. In
the absence of documented workup in the EMR, recommend diagnostic
ultrasound of the RIGHT breast.

## 2017-01-13 ENCOUNTER — Emergency Department (HOSPITAL_COMMUNITY)
Admission: EM | Admit: 2017-01-13 | Discharge: 2017-01-13 | Disposition: A | Discharge status: Home / Self Care | Payer: Self-pay | Attending: Emergency Medicine | Admitting: Emergency Medicine

## 2017-01-13 ENCOUNTER — Emergency Department (HOSPITAL_COMMUNITY): Payer: Self-pay

## 2017-01-13 ENCOUNTER — Encounter (HOSPITAL_COMMUNITY): Payer: Self-pay

## 2017-01-13 DIAGNOSIS — I10 Essential (primary) hypertension: Secondary | ICD-10-CM | POA: Insufficient documentation

## 2017-01-13 DIAGNOSIS — N6320 Unspecified lump in the left breast, unspecified quadrant: Secondary | ICD-10-CM | POA: Insufficient documentation

## 2017-01-13 DIAGNOSIS — R0789 Other chest pain: Secondary | ICD-10-CM | POA: Insufficient documentation

## 2017-01-13 DIAGNOSIS — R0602 Shortness of breath: Secondary | ICD-10-CM | POA: Insufficient documentation

## 2017-01-13 DIAGNOSIS — Z86718 Personal history of other venous thrombosis and embolism: Secondary | ICD-10-CM | POA: Insufficient documentation

## 2017-01-13 DIAGNOSIS — N63 Unspecified lump in unspecified breast: Secondary | ICD-10-CM

## 2017-01-13 DIAGNOSIS — Z7982 Long term (current) use of aspirin: Secondary | ICD-10-CM | POA: Insufficient documentation

## 2017-01-13 LAB — BASIC METABOLIC PANEL
Anion gap: 7 (ref 5–15)
BUN: 15 mg/dL (ref 6–20)
CO2: 24 mmol/L (ref 22–32)
Calcium: 8.7 mg/dL — ABNORMAL LOW (ref 8.9–10.3)
Chloride: 106 mmol/L (ref 101–111)
Creatinine, Ser: 0.92 mg/dL (ref 0.44–1.00)
GFR calc Af Amer: >60 mL/min (ref >60)
GFR calc non Af Amer: >60 mL/min (ref >60)
Glucose, Bld: 71 mg/dL (ref 65–99)
Potassium: 3.6 mmol/L (ref 3.5–5.1)
Sodium: 137 mmol/L (ref 135–145)

## 2017-01-13 LAB — CBC WITH DIFFERENTIAL/PLATELET
Basophils Absolute: 0 10*3/uL (ref 0.0–0.1)
Basophils Relative: 0 %
Eosinophils Absolute: 0.4 10*3/uL (ref 0.0–0.7)
Eosinophils Relative: 5 %
HCT: 39 % (ref 36.0–46.0)
Hemoglobin: 12.4 g/dL (ref 12.0–15.0)
Lymphocytes Relative: 36 %
Lymphs Abs: 3 10*3/uL (ref 0.7–4.0)
MCH: 25.7 pg — ABNORMAL LOW (ref 26.0–34.0)
MCHC: 31.8 g/dL (ref 30.0–36.0)
MCV: 80.7 fL (ref 78.0–100.0)
Monocytes Absolute: 0.6 10*3/uL (ref 0.1–1.0)
Monocytes Relative: 7 %
Neutro Abs: 4.2 10*3/uL (ref 1.7–7.7)
Neutrophils Relative %: 52 %
Platelets: 308 10*3/uL (ref 150–400)
RBC: 4.83 MIL/uL (ref 3.87–5.11)
RDW: 14.5 % (ref 11.5–15.5)
WBC: 8.2 10*3/uL (ref 4.0–10.5)

## 2017-01-13 LAB — I-STAT TROPONIN, ED: Troponin i, poc: 0 ng/mL (ref 0.00–0.08)

## 2017-01-13 MED ORDER — CEPHALEXIN 500 MG PO CAPS: 500.0000 mg | ORAL_CAPSULE | Freq: Four times a day (QID) | ORAL | 0 refills | Status: DC

## 2017-01-13 MED ORDER — IOPAMIDOL (ISOVUE-370) INJECTION 76%
INTRAVENOUS | Status: AC
  Administered 2017-01-13: 100 mL
  Filled 2017-01-13: qty 100

## 2017-01-13 MED ORDER — RIVAROXABAN (XARELTO) VTE STARTER PACK (15 & 20 MG): ORAL_TABLET | ORAL | 0 refills | Status: DC

## 2017-01-13 NOTE — Discharge Instructions (Signed)
Please read attached information. If you experience any new or worsening signs or symptoms please return to the emergency room for evaluation. Please follow-up with your primary care provider or specialist as discussed.  It is very important that you follow-up at the breast center for evaluation of this enlarging breast mass.  Please follow-up with primary care for ongoing evaluation and management of your anticoagulation and blood clots.  Please use medication prescribed only as directed and discontinue taking if you have any concerning signs or symptoms.

## 2017-01-13 NOTE — ED Notes (Signed)
Pt stable, ambulatory, states understanding of discharge instructions 

## 2017-01-13 NOTE — ED Triage Notes (Signed)
Pt arrived via [facility] c/o central throbbing chest pain.  Hx PE x10 years ago.  Stopped blood thinners 10 months ago.  EMS gave 324mg  ASA, 1 SL Nitro.

## 2017-01-13 NOTE — ED Provider Notes (Signed)
[facility] Provider Note   CSN: [id number] Arrival date & time: 01/13/17  1744     History   Chief Complaint Chief Complaint  Patient presents with  . Chest Pain    HPI Betty Harris is a 31 y.o. female.  HPI   31 year old female presents today with complaints of chest pain.  Patient reports a significant past medical history of pulmonary embolism.  She notes she was diagnosed in 2007.  She is uncertain the etiology, but notes a familial history of the same.  At that time she had an IVC filter placed and was encouraged to take Xarelto.  She notes she has been noncompliant since that time as she has not had regular primary care.  The most recent dose of Xarelto was in February 2017 when she was last seen in the emergency room for this.  She states that she occasionally has chest pain from time to time, but had an episode this morning upon wakening that has not resolved.  She notes this is central in nature wrapping under her left breast.  She reports this is worse with palpation of the chest wall.  She notes this is associated with shortness of breath.  She denies any lower extremity swelling or edema, she denies any fever chills or productive cough.  She notes inspiration and exertion does not make symptoms worse.  Patient reports this feeling is identical to when she had a PE in 2007.  Patient was given aspirin and nitroglycerin prior to evaluation without significant improvement in her symptoms.   Past Medical History:  Diagnosis Date  . History of blood clots   . Hypertension   . PTSD (post-traumatic stress disorder)     There are no active problems to display for this patient.   Past Surgical History:  Procedure Laterality Date  . ABDOMINAL HYSTERECTOMY    . VENA CAVA FILTER PLACEMENT      OB History    No data available       Home Medications    Prior to Admission medications   Medication Sig Start Date End Date  Taking? Authorizing Provider  aspirin 81 MG chewable tablet Chew 324 mg by mouth daily.   Yes [provider]  cephALEXin (KEFLEX) 500 MG capsule Take 1 capsule (500 mg total) by mouth 4 (four) times daily. 01/13/17   Findley Blankenbaker, Tinnie GensJeffrey, PA-C  Rivaroxaban 15 & 20 MG TBPK Take as directed on package: Start with one 15mg  tablet by mouth twice a day with food. On Day 22, switch to one 20mg  tablet once a day with food. 01/13/17   Eyvonne MechanicHedges, Mariusz Jubb, PA-C    Family History Family History  Problem Relation Age of Onset  . Hypertension Mother   . Hypertension Father     Social History Social History   Tobacco Use  . Smoking status: Former Games developermoker  . Smokeless tobacco: Never Used  Substance Use Topics  . Alcohol use: No  . Drug use: No     Allergies   Patient has no known allergies.   Review of Systems Review of Systems  All other systems reviewed and are negative.    Physical Exam Updated Vital Signs BP 120/72   Pulse 76   Temp 98.8 F (37.1 C) (Oral)   Resp (!) 26   LMP 08/06/2010   SpO2 100%   Physical Exam  Constitutional: She is oriented to person, place, and time. She appears well-developed and well-nourished.  HENT:  Head: Normocephalic and atraumatic.  Eyes: Conjunctivae are normal. Pupils are equal, round, and reactive to light. Right eye exhibits no discharge. Left eye exhibits no discharge. No scleral icterus.  Neck: Normal range of motion. No JVD present. No tracheal deviation present.  Cardiovascular: Normal rate, regular rhythm, normal heart sounds and intact distal pulses.  No murmur heard. Pulmonary/Chest: Effort normal and breath sounds normal. No stridor. No respiratory distress. She has no wheezes. She has no rales. She exhibits tenderness.  Tenderness over the sternum and under the left breast  Musculoskeletal: Normal range of motion. She exhibits no edema.  Neurological: She is alert and oriented to person, place, and time. Coordination normal.    Psychiatric: She has a normal mood and affect. Her behavior is normal. Judgment and thought content normal.  Nursing note and vitals reviewed.    ED Treatments / Results  Labs (all labs ordered are listed, but only abnormal results are displayed) Labs Reviewed  CBC WITH DIFFERENTIAL/PLATELET - Abnormal; Notable for the following components:      Result Value   MCH 25.7 (*)    All other components within normal limits  BASIC METABOLIC PANEL - Abnormal; Notable for the following components:   Calcium 8.7 (*)    All other components within normal limits  I-STAT TROPONIN, ED    EKG  EKG Interpretation  Date/Time:  Thursday January 13 2017 17:55:29 EST Ventricular Rate:  79 PR Interval:    QRS Duration: 82 QT Interval:  344 QTC Calculation: 395 R Axis:   65 Text Interpretation:  Sinus rhythm No significant change since last tracing Confirmed by Shaune Pollack [phone]) on 01/13/2017 6:09:46 PM       Radiology Ct Angio Chest Pe W And/or Wo Contrast  Result Date: 01/13/2017 CLINICAL DATA:  Central dropping chest pain EXAM: CT ANGIOGRAPHY CHEST WITH CONTRAST TECHNIQUE: Multidetector CT imaging of the chest was performed using the standard protocol during bolus administration of intravenous contrast. Multiplanar CT image reconstructions and MIPs were obtained to evaluate the vascular anatomy. CONTRAST:  ISOVUE-370 IOPAMIDOL (ISOVUE-370) INJECTION 76% COMPARISON:  06/02/2015 FINDINGS: Cardiovascular: Satisfactory opacification of the pulmonary arteries to the segmental level. No evidence of pulmonary embolism. Normal heart size. No pericardial effusion. Mediastinum/Nodes: No enlarged mediastinal, hilar, or axillary lymph nodes. Thyroid gland, trachea, and esophagus demonstrate no significant findings. Lungs/Pleura: Lungs are clear. No pleural effusion or pneumothorax. Upper Abdomen: No acute abnormality. Musculoskeletal: Re- demonstrated lobulated within the medial aspect of the  left breast, this measures 3.4 by 3.5 cm, compared with 2.7 x 3.3 cm previously. No acute or significant osseous findings. Review of the MIP images confirms the above findings. IMPRESSION: 1. Negative for acute pulmonary embolus.  Clear lung fields 2. Interval increase in size of a left medial breast mass, now measuring 3.5 cm. Recommend nonemergent ultrasound evaluation at dedicated breast facility. Electronically Signed   By: Jasmine Pang M.D.   On: 01/13/2017 18:45    Procedures Procedures (including critical care time)  Medications Ordered in ED Medications  iopamidol (ISOVUE-370) 76 % injection (100 mLs  Contrast Given 01/13/17 1813)     Initial Impression / Assessment and Plan / ED Course  I have reviewed the triage vital signs and the nursing notes.  Pertinent labs & imaging results that were available during my care of the patient were reviewed by me and considered in my medical decision making (see chart for details).      Final Clinical Impressions(s) / ED Diagnoses  Final diagnoses:  Chest wall pain  Breast mass    Labs: CBC, BMP, troponin  Imaging: DG chest 2 view, CT angios PE  Consults: Case management  Therapeutics:  Discharge Meds: Xarelto  Assessment/Plan: 31 year old female presents today with chest pain.  Patient reports on and off chest pain.  She does have tenderness along the medial aspect of the left breast.  She has noted mass on there on previous angios studies that she has not followed up for ultrasound for she has no signs of PE today, I discussed with the patient the need for evaluation of this breast mass as this could potentially be cancerous and life-threatening.  Patient additionally notes intermittent redness and swelling of this area, she will be covered for antibiotics for infectious source.  I discussed care with case management, we have established outpatient follow-up for the patient, she is encouraged to make this follow-up.  She is also  encouraged follow-up with her primary care for management of anticoagulation, prescription was given.  Patient given strict return precautions, she verbalized understanding and agreement to today's plan.  Patient has no signs of PE, ACS, or any other significant intrathoracic abnormality.      ED Discharge Orders        Ordered    cephALEXin (KEFLEX) 500 MG capsule  4 times daily     01/13/17 1941    Rivaroxaban 15 & 20 MG TBPK     01/13/17 1942       Eyvonne MechanicHedges, Kyia Rhude, PA-C 01/13/17 2023    Eyvonne MechanicHedges, Joden Bonsall, PA-C 01/13/17 2023    Shaune PollackIsaacs, Cameron, MD 01/14/17 1520

## 2017-01-13 NOTE — ED Notes (Signed)
Patient transported to CT 

## 2017-01-14 ENCOUNTER — Other Ambulatory Visit (HOSPITAL_COMMUNITY): Payer: Self-pay | Admitting: *Deleted

## 2017-01-14 DIAGNOSIS — N6320 Unspecified lump in the left breast, unspecified quadrant: Secondary | ICD-10-CM

## 2017-01-14 MED FILL — XARELTO 15 MG TABLET: 15 | 21 days supply | Qty: 42 | Fill #0

## 2017-01-17 ENCOUNTER — Telehealth: Payer: Self-pay

## 2017-01-17 NOTE — Telephone Encounter (Signed)
Opened in error

## 2017-01-17 NOTE — Telephone Encounter (Signed)
Message received from Michel BickersWandalyn Rogers, RN CM requesting a hospital follow up appointment for the patient at [facility] as she needs to [facility] to obtain her medications. An appointment has been scheduled for 01/19/17 @ 1430.  Call placed to the patient to remind her of the appointment and to explain to the the services provided by [facility] to assist with obtaining medications. Call placed to # [phone] (M ) and a HIPAA compliant voicemail message was left requesting a call back to # [phone].

## 2017-01-19 ENCOUNTER — Ambulatory Visit: Payer: Self-pay | Attending: Physician Assistant | Admitting: Physician Assistant

## 2017-01-19 ENCOUNTER — Other Ambulatory Visit: Payer: Self-pay

## 2017-01-19 VITALS — BP 123/79 | HR 91 | Resp 16 | Ht 69.0 in | Wt 247.2 lb

## 2017-01-19 DIAGNOSIS — S39012A Strain of muscle, fascia and tendon of lower back, initial encounter: Secondary | ICD-10-CM | POA: Insufficient documentation

## 2017-01-19 DIAGNOSIS — Z7901 Long term (current) use of anticoagulants: Secondary | ICD-10-CM | POA: Insufficient documentation

## 2017-01-19 DIAGNOSIS — Z09 Encounter for follow-up examination after completed treatment for conditions other than malignant neoplasm: Secondary | ICD-10-CM

## 2017-01-19 DIAGNOSIS — F17200 Nicotine dependence, unspecified, uncomplicated: Secondary | ICD-10-CM

## 2017-01-19 DIAGNOSIS — F4310 Post-traumatic stress disorder, unspecified: Secondary | ICD-10-CM | POA: Insufficient documentation

## 2017-01-19 DIAGNOSIS — N63 Unspecified lump in unspecified breast: Secondary | ICD-10-CM | POA: Insufficient documentation

## 2017-01-19 DIAGNOSIS — X58XXXA Exposure to other specified factors, initial encounter: Secondary | ICD-10-CM | POA: Insufficient documentation

## 2017-01-19 DIAGNOSIS — Z87891 Personal history of nicotine dependence: Secondary | ICD-10-CM | POA: Insufficient documentation

## 2017-01-19 DIAGNOSIS — Z9119 Patient's noncompliance with other medical treatment and regimen: Secondary | ICD-10-CM | POA: Insufficient documentation

## 2017-01-19 DIAGNOSIS — I10 Essential (primary) hypertension: Secondary | ICD-10-CM | POA: Insufficient documentation

## 2017-01-19 DIAGNOSIS — I2699 Other pulmonary embolism without acute cor pulmonale: Secondary | ICD-10-CM

## 2017-01-19 DIAGNOSIS — Z86711 Personal history of pulmonary embolism: Secondary | ICD-10-CM | POA: Insufficient documentation

## 2017-01-19 MED ORDER — METHOCARBAMOL 500 MG PO TABS: 500.0000 mg | ORAL_TABLET | Freq: Three times a day (TID) | ORAL | 0 refills | Status: DC

## 2017-01-19 MED ORDER — RIVAROXABAN 20 MG PO TABS: 20.0000 mg | ORAL_TABLET | Freq: Every day | ORAL | 5 refills | Status: DC

## 2017-01-19 MED FILL — METHOCARBAMOL 500 MG TABS: 500 | 30 days supply | Qty: 90 | Fill #0

## 2017-01-19 NOTE — Progress Notes (Signed)
Patient ID: Betty LemaCarlotte M Harris, female   DOB: [birth year], 32 y.o.   MRN: [id number]     Betty FlattenCarlotte Harris, is a 32 y.o. female  [id number]  [id number]  DOB - [birth year]  Subjective:  Chief Complaint and HPI: Betty Harris is a 32 y.o. female here today to establish care and for a follow up visit After being seen in the ED for CP.  EKG showed no acute changes.  She does have a h/o PE in 2007.  +FH for PE.  (has IVC filter and placed on xarelto but has been non-compliant).  CT angiogram was negative for acute PE.  Enlarging breast lesion was detected and ultrasounds were scheduled.  Cardiac enzymes negative.  Labs essentially normal.  Xarelto was restarted. She has restarted the xarelto and needs Rx for the continuing pack.  No CP/no SOB.  No palpitations.    Breast lump enlarging in size was detected and Mammogram/U/S was scheduled for tomorrow at 2pm.    She quit smoking the day after christmas.  Denies cravings.    Also c/o low back pain for many years.  Bothers her frequently.  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  No known injury.  No paresthesias.  No weakness.  Usually uses icy hot/ben gay.  No urinary s/sx.    ED/Hospital notes reviewed.   Social History:  Works as a Agricultural engineernursing assistant; just quit smoking  ROS:   Constitutional:  No f/c, No night sweats, No unexplained weight loss. EENT:  No vision changes, No blurry vision, No hearing changes. No mouth, throat, or ear problems.  Respiratory: No cough, No SOB Cardiac: No CP, no palpitations GI:  No abd pain, No N/V/D. GU: No Urinary s/sx Musculoskeletal: +back pain Neuro: No headache, no dizziness, no motor weakness.  Skin: No rash Endocrine:  No polydipsia. No polyuria.  Psych: Denies SI/HI  No problems updated.  ALLERGIES: No Known Allergies  PAST MEDICAL HISTORY: Past Medical History:  Diagnosis Date  . History of blood clots   . Hypertension   . PTSD (post-traumatic stress  disorder)     MEDICATIONS AT HOME: Prior to Admission medications   Medication Sig Start Date End Date Taking? Authorizing Provider  cephALEXin (KEFLEX) 500 MG capsule Take 1 capsule (500 mg total) by mouth 4 (four) times daily. 01/13/17  Yes Hedges, Tinnie GensJeffrey, PA-C  Rivaroxaban 15 & 20 MG TBPK Take as directed on package: Start with one 15mg  tablet by mouth twice a day with food. On Day 22, switch to one 20mg  tablet once a day with food. 01/13/17  Yes Hedges, Tinnie GensJeffrey, PA-C  methocarbamol (ROBAXIN) 500 MG tablet Take 1 tablet (500 mg total) by mouth 3 (three) times daily. Prn muscle spasm 01/19/17   Anders SimmondsMcClung, Damonique Brunelle M, PA-C  rivaroxaban (XARELTO) 20 MG TABS tablet Take 1 tablet (20 mg total) by mouth daily with supper. 01/19/17   Anders SimmondsMcClung, Adrain Butrick M, PA-C     Objective:  EXAM:   Vitals:   01/19/17 1435  BP: 123/79  Pulse: 91  Resp: 16  SpO2: 99%  Weight: 247 lb 3.2 oz (112.1 kg)  Height: 5\' 9"  (1.753 m)    General appearance : A&OX3. NAD. Non-toxic-appearing HEENT: Atraumatic and Normocephalic.  PERRLA. EOM intact.   Neck: supple, no JVD. No cervical lymphadenopathy. No thyromegaly Chest/Lungs:  Breathing-non-labored, Good air entry bilaterally, breath sounds normal without rales, rhonchi, or wheezing  CVS: S1 S2 regular, no murmurs, gallops, rubs  Back-ROM ~80% of normal.  No TTP.  There is paraspinus spasm B lumbar region.  Neg SLR B.  DTR=BLE.   Extremities: Bilateral Lower Ext shows no edema, both legs are warm to touch with = pulse throughout Neurology:  CN II-XII grossly intact, Non focal.   Psych:  TP linear. J/I WNL. Normal speech. Appropriate eye contact and affect.  Skin:  No Rash  Data Review No results found for: HGBA1C   Assessment & Plan   1. Other pulmonary embolism without acute cor pulmonale, unspecified chronicity (HCC) that occurred in 2007-supposed to be on xarelto indefinitely and had been off-just restarted at ED visit.   - rivaroxaban (XARELTO) 20 MG TABS  tablet; Take 1 tablet (20 mg total) by mouth daily with supper.  Dispense: 30 tablet; Refill: 5  2. Strain of lumbar region, initial encounter Stretching, continue icy hot as needed.  Exercise and strengthening encouraged.   - methocarbamol (ROBAXIN) 500 MG tablet; Take 1 tablet (500 mg total) by mouth 3 (three) times daily. Prn muscle spasm  Dispense: 90 tablet; Refill: 0  3. Hospital discharge follow-up Improving-no further CP  4. Breast lump Keep appt for tomorrow at 2pm for U/S/MMG  5. Smoker Congratulations on cessation!!!  Keep it up!   Patient have been counseled extensively about nutrition and exercise  Return in about 1 month (around 02/19/2017) for assign PCP; follow up xarelto, breast lump and smoking cessation/LBP.  The patient was given clear instructions to go to ER or return to medical center if symptoms don't improve, worsen or new problems develop. The patient verbalized understanding. The patient was told to call to get lab results if they haven't heard anything in the next week.     Georgian Co, PA-C [facility] [place], [place] [phone]   01/19/2017, 3:06 PM

## 2017-01-19 NOTE — Patient Instructions (Addendum)
Low Back Sprain Rehab  Ask your health care provider which exercises are safe for you. Do exercises exactly as told by your health care provider and adjust them as directed. It is normal to feel mild stretching, pulling, tightness, or discomfort as you do these exercises, but you should stop right away if you feel sudden pain or your pain gets worse. Do not begin these exercises until told by your health care provider.  Stretching and range of motion exercises  These exercises warm up your muscles and joints and improve the movement and flexibility of your back. These exercises also help to relieve pain, numbness, and tingling.  Exercise A: Lumbar rotation    1. Lie on your back on a firm surface and bend your knees.  2. Straighten your arms out to your sides so each arm forms an "L" shape with a side of your body (a 90 degree angle).  3. Slowly move both of your knees to one side of your body until you feel a stretch in your lower back. Try not to let your shoulders move off of the floor.  4. Hold for __________ seconds.  5. Tense your abdominal muscles and slowly move your knees back to the starting position.  6. Repeat this exercise on the other side of your body.  Repeat __________ times. Complete this exercise __________ times a day.  Exercise B: Prone extension on elbows    1. Lie on your abdomen on a firm surface.  2. Prop yourself up on your elbows.  3. Use your arms to help lift your chest up until you feel a gentle stretch in your abdomen and your lower back.  ? This will place some of your body weight on your elbows. If this is uncomfortable, try stacking pillows under your chest.  ? Your hips should stay down, against the surface that you are lying on. Keep your hip and back muscles relaxed.  4. Hold for __________ seconds.  5. Slowly relax your upper body and return to the starting position.  Repeat __________ times. Complete this exercise __________ times a day.  Strengthening exercises  These  exercises build strength and endurance in your back. Endurance is the ability to use your muscles for a long time, even after they get tired.  Exercise C: Pelvic tilt  1. Lie on your back on a firm surface. Bend your knees and keep your feet flat.  2. Tense your abdominal muscles. Tip your pelvis up toward the ceiling and flatten your lower back into the floor.  ? To help with this exercise, you may place a small towel under your lower back and try to push your back into the towel.  3. Hold for __________ seconds.  4. Let your muscles relax completely before you repeat this exercise.  Repeat __________ times. Complete this exercise __________ times a day.  Exercise D: Alternating arm and leg raises    1. Get on your hands and knees on a firm surface. If you are on a hard floor, you may want to use padding to cushion your knees, such as an exercise mat.  2. Line up your arms and legs. Your hands should be below your shoulders, and your knees should be below your hips.  3. Lift your left leg behind you. At the same time, raise your right arm and straighten it in front of you.  ? Do not lift your leg higher than your hip.  ? Do not lift your arm   higher than your shoulder.  ? Keep your abdominal and back muscles tight.  ? Keep your hips facing the ground.  ? Do not arch your back.  ? Keep your balance carefully, and do not hold your breath.  4. Hold for __________ seconds.  5. Slowly return to the starting position and repeat with your right leg and your left arm.  Repeat __________ times. Complete this exercise __________ times a day.  Exercise E: Abdominal set with straight leg raise    1. Lie on your back on a firm surface.  2. Bend one of your knees and keep your other leg straight.  3. Tense your abdominal muscles and lift your straight leg up, 4-6 inches (10-15 cm) off the ground.  4. Keep your abdominal muscles tight and hold for __________ seconds.  ? Do not hold your breath.  ? Do not arch your back. Keep it  flat against the ground.  5. Keep your abdominal muscles tense as you slowly lower your leg back to the starting position.  6. Repeat with your other leg.  Repeat __________ times. Complete this exercise __________ times a day.  Posture and body mechanics    Body mechanics refers to the movements and positions of your body while you do your daily activities. Posture is part of body mechanics. Good posture and healthy body mechanics can help to relieve stress in your body's tissues and joints. Good posture means that your spine is in its natural S-curve position (your spine is neutral), your shoulders are pulled back slightly, and your head is not tipped forward. The following are general guidelines for applying improved posture and body mechanics to your everyday activities.  Standing    · When standing, keep your spine neutral and your feet about hip-width apart. Keep a slight bend in your knees. Your ears, shoulders, and hips should line up.  · When you do a task in which you stand in one place for a long time, place one foot up on a stable object that is 2-4 inches (5-10 cm) high, such as a footstool. This helps keep your spine neutral.  Sitting    · When sitting, keep your spine neutral and keep your feet flat on the floor. Use a footrest, if necessary, and keep your thighs parallel to the floor. Avoid rounding your shoulders, and avoid tilting your head forward.  · When working at a desk or a computer, keep your desk at a height where your hands are slightly lower than your elbows. Slide your chair under your desk so you are close enough to maintain good posture.  · When working at a computer, place your monitor at a height where you are looking straight ahead and you do not have to tilt your head forward or downward to look at the screen.  Resting    · When lying down and resting, avoid positions that are most painful for you.  · If you have pain with activities such as sitting, bending, stooping, or squatting  (flexion-based activities), lie in a position in which your body does not bend very much. For example, avoid curling up on your side with your arms and knees near your chest (fetal position).  · If you have pain with activities such as standing for a long time or reaching with your arms (extension-based activities), lie with your spine in a neutral position and bend your knees slightly. Try the following positions:  · Lying on your side with a   pillow between your knees.  · Lying on your back with a pillow under your knees.  Lifting    · When lifting objects, keep your feet at least shoulder-width apart and tighten your abdominal muscles.  · Bend your knees and hips and keep your spine neutral. It is important to lift using the strength of your legs, not your back. Do not lock your knees straight out.  · Always ask for help to lift heavy or awkward objects.  This information is not intended to replace advice given to you by your health care provider. Make sure you discuss any questions you have with your health care provider.  Document Released: 01/04/2005 Document Revised: 09/11/2015 Document Reviewed: 10/16/2014  Elsevier Interactive Patient Education © 2018 [employer]vier Inc.

## 2017-01-20 ENCOUNTER — Encounter (HOSPITAL_COMMUNITY): Payer: Self-pay

## 2017-01-20 ENCOUNTER — Ambulatory Visit (HOSPITAL_COMMUNITY)
Admission: RE | Admit: 2017-01-20 | Discharge: 2017-01-20 | Disposition: A | Discharge status: Home / Self Care | Payer: Self-pay | Source: Ambulatory Visit | Attending: Obstetrics and Gynecology | Admitting: Obstetrics and Gynecology

## 2017-01-20 ENCOUNTER — Ambulatory Visit
Admission: RE | Admit: 2017-01-20 | Discharge: 2017-01-20 | Disposition: A | Discharge status: Home / Self Care | Payer: Medicaid Other | Source: Ambulatory Visit | Attending: Obstetrics and Gynecology | Admitting: Obstetrics and Gynecology

## 2017-01-20 ENCOUNTER — Other Ambulatory Visit (HOSPITAL_COMMUNITY): Payer: Self-pay | Admitting: Obstetrics and Gynecology

## 2017-01-20 VITALS — BP 120/78 | Temp 98.7°F | Ht 69.0 in | Wt 245.2 lb

## 2017-01-20 DIAGNOSIS — N6320 Unspecified lump in the left breast, unspecified quadrant: Secondary | ICD-10-CM

## 2017-01-20 DIAGNOSIS — N644 Mastodynia: Secondary | ICD-10-CM

## 2017-01-20 DIAGNOSIS — Z1239 Encounter for other screening for malignant neoplasm of breast: Secondary | ICD-10-CM

## 2017-01-20 NOTE — Progress Notes (Signed)
Complaints of left breast lump since February 2018 that has increased in size, turned red, and painful. Patient states the pain is constant. Patient rates the pain at a 9 out of 10.  Pap Smear: Pap smear not completed today. Last Pap smear was in 2007 in [place] and normal per patient. Per patient has no history of an abnormal Pap smear. Per patient has a history of a hysterectomy in 2007 due to hemorrhaging during pregnancy. Patient no longer needs Pap smears due to her history of a hysterectomy for benign reasons per BCCCP and ACOG guidelines. No Pap smear results are in Epic.  Physical exam: Breasts Breasts symmetrical. No skin abnormalities right breast. Left breast slightly reddened above nipple. No nipple retraction bilateral breasts. No nipple discharge bilateral breasts. No lymphadenopathy. No lumps palpated bilateral breasts. Unable to palpated a lump in patients area of concern. Complaints of diffuse left breast pain on exam. [facility]. Appointment scheduled for Thursday, January 20, 2017 following [facility] appointment.       Pelvic/Bimanual No Pap smear completed today since patient has a history of a hysterectomy for benign reasons. Pap smear not indicated per B[facility]CP guidelines.   Smoking History: Patient is a former smoker that quit 01/12/2017.  Patient Navigation: Patient education provided. [facility].

## 2017-01-20 NOTE — Patient Instructions (Signed)
Explained breast self awareness with Karri Elson ClanM Huster. Patient did not need a Pap smear today due to her history of a hysterectomy for benign reasons. Let her know that she doesn't need any further Pap smears due to her history of a hysterectomy for benign reasons. [facility]. Appointment scheduled for Thursday, January 20, 2017 following B[facility]CP appointment. Rikki Elson ClanM Schryver verbalized understanding.  Yasiel Goyne, Kathaleen Maserhristine Poll, RN 2:52 PM

## 2017-01-21 ENCOUNTER — Ambulatory Visit
Admission: RE | Admit: 2017-01-21 | Discharge: 2017-01-21 | Disposition: A | Discharge status: Home / Self Care | Payer: No Typology Code available for payment source | Source: Ambulatory Visit | Attending: Obstetrics and Gynecology | Admitting: Obstetrics and Gynecology

## 2017-01-21 ENCOUNTER — Encounter (HOSPITAL_COMMUNITY): Payer: Self-pay | Admitting: *Deleted

## 2017-01-21 DIAGNOSIS — N6320 Unspecified lump in the left breast, unspecified quadrant: Secondary | ICD-10-CM

## 2017-01-26 ENCOUNTER — Other Ambulatory Visit: Payer: Medicaid Other

## 2017-02-21 ENCOUNTER — Ambulatory Visit: Payer: Self-pay | Attending: Family Medicine | Admitting: Family Medicine

## 2017-02-21 ENCOUNTER — Encounter: Payer: Self-pay | Admitting: Family Medicine

## 2017-02-21 ENCOUNTER — Ambulatory Visit: Payer: Self-pay | Admitting: Licensed Clinical Social Worker

## 2017-02-21 VITALS — BP 116/75 | HR 78 | Temp 98.2°F | Resp 16 | Ht 66.0 in | Wt 245.8 lb

## 2017-02-21 DIAGNOSIS — F4321 Adjustment disorder with depressed mood: Secondary | ICD-10-CM | POA: Insufficient documentation

## 2017-02-21 DIAGNOSIS — Z86711 Personal history of pulmonary embolism: Secondary | ICD-10-CM | POA: Insufficient documentation

## 2017-02-21 DIAGNOSIS — Z634 Disappearance and death of family member: Secondary | ICD-10-CM

## 2017-02-21 DIAGNOSIS — N644 Mastodynia: Secondary | ICD-10-CM | POA: Insufficient documentation

## 2017-02-21 DIAGNOSIS — F329 Major depressive disorder, single episode, unspecified: Secondary | ICD-10-CM

## 2017-02-21 DIAGNOSIS — F17200 Nicotine dependence, unspecified, uncomplicated: Secondary | ICD-10-CM | POA: Insufficient documentation

## 2017-02-21 DIAGNOSIS — F419 Anxiety disorder, unspecified: Secondary | ICD-10-CM | POA: Insufficient documentation

## 2017-02-21 DIAGNOSIS — F32A Depression, unspecified: Secondary | ICD-10-CM

## 2017-02-21 DIAGNOSIS — Z832 Family history of diseases of the blood and blood-forming organs and certain disorders involving the immune mechanism: Secondary | ICD-10-CM | POA: Insufficient documentation

## 2017-02-21 DIAGNOSIS — Z7902 Long term (current) use of antithrombotics/antiplatelets: Secondary | ICD-10-CM | POA: Insufficient documentation

## 2017-02-21 DIAGNOSIS — Z23 Encounter for immunization: Secondary | ICD-10-CM | POA: Insufficient documentation

## 2017-02-21 DIAGNOSIS — F4323 Adjustment disorder with mixed anxiety and depressed mood: Secondary | ICD-10-CM

## 2017-02-21 DIAGNOSIS — Z9189 Other specified personal risk factors, not elsewhere classified: Secondary | ICD-10-CM

## 2017-02-21 DIAGNOSIS — Z90710 Acquired absence of both cervix and uterus: Secondary | ICD-10-CM | POA: Insufficient documentation

## 2017-02-21 DIAGNOSIS — F4329 Adjustment disorder with other symptoms: Secondary | ICD-10-CM

## 2017-02-21 MED ORDER — NICOTINE 14 MG/24HR TD PT24: 14.0000 mg | MEDICATED_PATCH | Freq: Every day | TRANSDERMAL | 1 refills | Status: DC

## 2017-02-21 MED ORDER — PAROXETINE HCL 20 MG PO TABS: 20.0000 mg | ORAL_TABLET | Freq: Every day | ORAL | 2 refills | Status: DC

## 2017-02-21 MED ORDER — ACETAMINOPHEN 500 MG PO TABS: 1000.0000 mg | ORAL_TABLET | Freq: Three times a day (TID) | ORAL | 0 refills | Status: DC | PRN

## 2017-02-21 MED FILL — ?PAROXETINE HCL 20 MG TABLE: 20 | 30 days supply | Qty: 30 | Fill #0

## 2017-02-21 MED FILL — NICOTINE 14 MG/24HR PATCH: 14 | 28 days supply | Qty: 28 | Fill #0

## 2017-02-21 NOTE — Progress Notes (Signed)
Patient is here to establish care.   Patient stated she has pain on her left breast, anxiety, and depression.
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
## 2017-02-21 NOTE — Patient Instructions (Addendum)
Go to pharmacy for tetanus vaccination. Coping with Quitting Smoking Quitting smoking is a physical and mental challenge. You will face cravings, withdrawal symptoms, and temptation. Before quitting, work with your health care provider to make a plan that can help you cope. Preparation can help you quit and keep you from giving in. How can I cope with cravings? Cravings usually last for 5-10 minutes. If you get through it, the craving will pass. Consider taking the following actions to help you cope with cravings:  Keep your mouth busy: ? Chew sugar-free gum. ? Suck on hard candies or a straw. ? Brush your teeth.  Keep your hands and body busy: ? Immediately change to a different activity when you feel a craving. ? Squeeze or play with a ball. ? Do an activity or a hobby, like making bead jewelry, practicing needlepoint, or working with wood. ? Mix up your normal routine. ? Take a short exercise break. Go for a quick walk or run up and down stairs. ? Spend time in public places where smoking is not allowed.  Focus on doing something kind or helpful for someone else.  Call a friend or family member to talk during a craving.  Join a support group.  Call a quit line, such as 1-800-QUIT-NOW.  Talk with your health care provider about medicines that might help you cope with cravings and make quitting easier for you.  How can I deal with withdrawal symptoms? Your body may experience negative effects as it tries to get used to not having nicotine in the system. These effects are called withdrawal symptoms. They may include:  Feeling hungrier than normal.  Trouble concentrating.  Irritability.  Trouble sleeping.  Feeling depressed.  Restlessness and agitation.  Craving a cigarette.  To manage withdrawal symptoms:  Avoid places, people, and activities that trigger your cravings.  Remember why you want to quit.  Get plenty of sleep.  Avoid coffee and other caffeinated  drinks. These may worsen some of your symptoms.  How can I handle social situations? Social situations can be difficult when you are quitting smoking, especially in the first few weeks. To manage this, you can:  Avoid parties, bars, and other social situations where people might be smoking.  Avoid alcohol.  Leave right away if you have the urge to smoke.  Explain to your family and friends that you are quitting smoking. Ask for understanding and support.  Plan activities with friends or family where smoking is not an option.  What are some ways I can cope with stress? Wanting to smoke may cause stress, and stress can make you want to smoke. Find ways to manage your stress. Relaxation techniques can help. For example:  Breathe slowly and deeply, in through your nose and out through your mouth.  Listen to soothing, relaxing music.  Talk with a family member or friend about your stress.  Light a candle.  Soak in a bath or take a shower.  Think about a peaceful place.  What are some ways I can prevent weight gain? Be aware that many people gain weight after they quit smoking. However, not everyone does. To keep from gaining weight, have a plan in place before you quit and stick to the plan after you quit. Your plan should include:  Having healthy snacks. When you have a craving, it may help to: ? Eat plain popcorn, crunchy carrots, celery, or other cut vegetables. ? Chew sugar-free gum.  Changing how you eat: ? Eat  small portion sizes at meals. ? Eat 4-6 small meals throughout the day instead of 1-2 large meals a day. ? Be mindful when you eat. Do not watch television or do other things that might distract you as you eat.  Exercising regularly: ? Make time to exercise each day. If you do not have time for a long workout, do short bouts of exercise for 5-10 minutes several times a day. ? Do some form of strengthening exercise, like weight lifting, and some form of aerobic  exercise, like running or swimming.  Drinking plenty of water or other low-calorie or no-calorie drinks. Drink 6-8 glasses of water daily, or as much as instructed by your health care provider.  Summary  Quitting smoking is a physical and mental challenge. You will face cravings, withdrawal symptoms, and temptation to smoke again. Preparation can help you as you go through these challenges.  You can cope with cravings by keeping your mouth busy (such as by chewing gum), keeping your body and hands busy, and making calls to family, friends, or a helpline for people who want to quit smoking.  You can cope with withdrawal symptoms by avoiding places where people smoke, avoiding drinks with caffeine, and getting plenty of rest.  Ask your health care provider about the different ways to prevent weight gain, avoid stress, and handle social situations. This information is not intended to replace advice given to you by your health care provider. Make sure you discuss any questions you have with your health care provider. Document Released: 01/02/2016 Document Revised: 01/02/2016 Document Reviewed: 01/02/2016 Elsevier Interactive Patient Education  [employer]2018 Elsevier Inc.
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
## 2017-02-24 ENCOUNTER — Ambulatory Visit (HOSPITAL_BASED_OUTPATIENT_CLINIC_OR_DEPARTMENT_OTHER): Payer: No Typology Code available for payment source | Attending: Family Medicine | Admitting: Internal Medicine

## 2017-02-24 VITALS — Ht 66.0 in | Wt 245.0 lb

## 2017-02-24 DIAGNOSIS — Z9189 Other specified personal risk factors, not elsewhere classified: Secondary | ICD-10-CM

## 2017-02-24 DIAGNOSIS — G4730 Sleep apnea, unspecified: Secondary | ICD-10-CM | POA: Insufficient documentation

## 2017-02-24 DIAGNOSIS — R0683 Snoring: Secondary | ICD-10-CM | POA: Insufficient documentation

## 2017-02-25 ENCOUNTER — Ambulatory Visit: Payer: Medicaid Other

## 2017-03-02 ENCOUNTER — Ambulatory Visit (HOSPITAL_COMMUNITY)
Admission: EM | Admit: 2017-03-02 | Discharge: 2017-03-02 | Disposition: A | Discharge status: Home / Self Care | Payer: No Typology Code available for payment source | Attending: Family Medicine | Admitting: Family Medicine

## 2017-03-02 ENCOUNTER — Encounter (HOSPITAL_COMMUNITY): Payer: Self-pay | Admitting: Emergency Medicine

## 2017-03-02 ENCOUNTER — Other Ambulatory Visit: Payer: Self-pay

## 2017-03-02 DIAGNOSIS — Z90710 Acquired absence of both cervix and uterus: Secondary | ICD-10-CM | POA: Insufficient documentation

## 2017-03-02 DIAGNOSIS — R300 Dysuria: Secondary | ICD-10-CM | POA: Insufficient documentation

## 2017-03-02 DIAGNOSIS — F4310 Post-traumatic stress disorder, unspecified: Secondary | ICD-10-CM | POA: Diagnosis not present

## 2017-03-02 DIAGNOSIS — R1031 Right lower quadrant pain: Secondary | ICD-10-CM | POA: Diagnosis not present

## 2017-03-02 DIAGNOSIS — Z90721 Acquired absence of ovaries, unilateral: Secondary | ICD-10-CM | POA: Diagnosis not present

## 2017-03-02 DIAGNOSIS — F17200 Nicotine dependence, unspecified, uncomplicated: Secondary | ICD-10-CM | POA: Insufficient documentation

## 2017-03-02 DIAGNOSIS — I110 Hypertensive heart disease with heart failure: Secondary | ICD-10-CM | POA: Diagnosis not present

## 2017-03-02 DIAGNOSIS — Z7902 Long term (current) use of antithrombotics/antiplatelets: Secondary | ICD-10-CM | POA: Insufficient documentation

## 2017-03-02 DIAGNOSIS — Z79899 Other long term (current) drug therapy: Secondary | ICD-10-CM | POA: Insufficient documentation

## 2017-03-02 DIAGNOSIS — Z86718 Personal history of other venous thrombosis and embolism: Secondary | ICD-10-CM | POA: Diagnosis not present

## 2017-03-02 DIAGNOSIS — E119 Type 2 diabetes mellitus without complications: Secondary | ICD-10-CM | POA: Diagnosis not present

## 2017-03-02 DIAGNOSIS — I509 Heart failure, unspecified: Secondary | ICD-10-CM | POA: Insufficient documentation

## 2017-03-02 LAB — POCT URINALYSIS DIP (DEVICE)
BILIRUBIN URINE: NEGATIVE
GLUCOSE, UA: NEGATIVE mg/dL
HGB URINE DIPSTICK: NEGATIVE
Ketones, ur: NEGATIVE mg/dL
LEUKOCYTES UA: NEGATIVE
Nitrite: POSITIVE — AB
Protein, ur: NEGATIVE mg/dL
Specific Gravity, Urine: 1.025 (ref 1.005–1.030)
UROBILINOGEN UA: 0.2 mg/dL (ref 0.0–1.0)
pH: 7 (ref 5.0–8.0)

## 2017-03-02 MED ORDER — CEPHALEXIN 500 MG PO CAPS: 500.0000 mg | ORAL_CAPSULE | Freq: Four times a day (QID) | ORAL | 0 refills | Status: AC

## 2017-03-02 NOTE — ED Triage Notes (Signed)
Pt reports lower abdominal and back pain x3 weeks.  She states it got worse yesterday and she now has pain with urination and urinary frequency.

## 2017-03-02 NOTE — ED Provider Notes (Signed)
[facility]    CSN: [id number] Arrival date & time: 03/02/17  1507     History   Chief Complaint Chief Complaint  Patient presents with  . Urinary Tract Infection    HPI Betty Harris is a 32 y.o. female history of hypertension, diabetes, CHF, previous hysterectomy and oophorectomy presenting today with lower abdominal pain, back pain, dysuria and increased frequency.  Symptoms began approximately 3 weeks ago, worsened last night.  She states she is constantly having to go to the restroom, and has a sensation of incomplete voiding.  She states she does have some mild discharge, but no pelvic pain.  She holds pressure on her right lower quadrant to help with pain.  Back pain is bilateral in the lumbar.  Denies fever, nausea, vomiting, diarrhea.  States she is eating and drinking without issue.  HPI  Past Medical History:  Diagnosis Date  . History of blood clots   . Hypertension   . PTSD (post-traumatic stress disorder)     Patient Active Problem List   Diagnosis Date Noted  . History of hysterectomy 02/21/2017  . Breast mass in female 02/22/2013  . Pulmonary embolism (HCC) 02/19/2013  . Acute thromboembolism of deep veins of lower extremity (HCC) 10/30/2012    Past Surgical History:  Procedure Laterality Date  . ABDOMINAL HYSTERECTOMY    . VENA CAVA FILTER PLACEMENT      OB History    Gravida Para Term Preterm AB Living   2       1     SAB TAB Ectopic Multiple Live Births   1       1       Home Medications    Prior to Admission medications   Medication Sig Start Date End Date Taking? Authorizing Provider  methocarbamol (ROBAXIN) 500 MG tablet Take 1 tablet (500 mg total) by mouth 3 (three) times daily. Prn muscle spasm 01/19/17  Yes McClung, Angela M, PA-C  nicotine (NICODERM CQ - DOSED IN MG/24 HOURS) 14 mg/24hr patch Place 1 patch (14 mg total) onto the skin daily. 02/21/17  Yes Hairston, Leonia ReevesMandesia R, FNP  PARoxetine (PAXIL) 20 MG tablet Take 1  tablet (20 mg total) by mouth daily. 02/21/17  Yes Hairston, Oren BeckmannMandesia R, FNP  rivaroxaban (XARELTO) 20 MG TABS tablet Take 1 tablet (20 mg total) by mouth daily with supper. 01/19/17  Yes Anders SimmondsMcClung, Angela M, PA-C  acetaminophen (TYLENOL) 500 MG tablet Take 2 tablets (1,000 mg total) by mouth every 8 (eight) hours as needed for moderate pain. 02/21/17   Lizbeth BarkHairston, Mandesia R, FNP  cephALEXin (KEFLEX) 500 MG capsule Take 1 capsule (500 mg total) by mouth 4 (four) times daily for 7 days. 03/02/17 03/09/17  Miela Desjardin C, PA-C  Rivaroxaban 15 & 20 MG TBPK Take as directed on package: Start with one 15mg  tablet by mouth twice a day with food. On Day 22, switch to one 20mg  tablet once a day with food. Patient not taking: Reported on 01/20/2017 01/13/17   Eyvonne MechanicHedges, Jeffrey, PA-C    Family History Family History  Problem Relation Age of Onset  . Hypertension Mother   . Hypertension Father     Social History Social History   Tobacco Use  . Smoking status: Current Every Day Smoker    Last attempt to quit: 01/12/2017    Years since quitting: 0.1  . Smokeless tobacco: Never Used  Substance Use Topics  . Alcohol use: Yes    Comment: OCC  .  Drug use: No     Allergies   Patient has no known allergies.   Review of Systems Review of Systems  Constitutional: Negative for fever.  Respiratory: Negative for shortness of breath.   Cardiovascular: Negative for chest pain.  Gastrointestinal: Positive for abdominal pain. Negative for diarrhea, nausea and vomiting.  Genitourinary: Positive for dysuria, frequency and vaginal discharge. Negative for flank pain, genital sores, hematuria, menstrual problem, vaginal bleeding and vaginal pain.  Musculoskeletal: Positive for back pain.  Skin: Negative for rash.  Neurological: Negative for dizziness, light-headedness and headaches.     Physical Exam Triage Vital Signs ED Triage Vitals  Enc Vitals Group     BP 03/02/17 1622 133/88     Pulse Rate 03/02/17 1622  91     Resp --      Temp 03/02/17 1622 98.7 F (37.1 C)     Temp Source 03/02/17 1622 Oral     SpO2 03/02/17 1622 100 %     Weight --      Height --      Head Circumference --      Peak Flow --      Pain Score 03/02/17 1619 9     Pain Loc --      Pain Edu? --      Excl. in GC? --    No data found.  Updated Vital Signs BP 133/88 (BP Location: Left Arm)   Pulse 91   Temp 98.7 F (37.1 C) (Oral)   LMP 08/06/2010   SpO2 100%    Physical Exam  Constitutional: She appears well-developed and well-nourished. No distress.  HENT:  Head: Normocephalic and atraumatic.  Eyes: Conjunctivae are normal.  Neck: Neck supple.  Cardiovascular: Normal rate and regular rhythm.  No murmur heard. Pulmonary/Chest: Effort normal and breath sounds normal. No respiratory distress.  Abdominal: Soft. There is tenderness.  Abdomen is soft, striae present.  Patient with tenderness to right lower quadrant, suprapubic area.  Patient does report pain is worse when letting go versus pressing down.  Genitourinary:  Genitourinary Comments: Milky white discharge in vaginal vault  Musculoskeletal: She exhibits no edema.  Neurological: She is alert.  Skin: Skin is warm and dry.  Psychiatric: She has a normal mood and affect.  Nursing note and vitals reviewed.    UC Treatments / Results  Labs (all labs ordered are listed, but only abnormal results are displayed) Labs Reviewed  POCT URINALYSIS DIP (DEVICE) - Abnormal; Notable for the following components:      Result Value   Nitrite POSITIVE (*)    All other components within normal limits  CERVICOVAGINAL ANCILLARY ONLY    EKG  EKG Interpretation None       Radiology No results found.  Procedures Procedures (including critical care time)  Medications Ordered in UC Medications - No data to display   Initial Impression / Assessment and Plan / UC Course  I have reviewed the triage vital signs and the nursing notes.  Pertinent labs &  imaging results that were available during my care of the patient were reviewed by me and considered in my medical decision making (see chart for details).     Patient presenting with concern for UTI with associated abdominal pain and back pain.  UA with positive nitrite.  Will treat with Keflex.  Abdominal exam concerning for possible peritonitis, although she is eating and drinking without issue, no nausea or vomiting.  Pain seems more related to bladder/pelvic region versus an  acute appendicitis.  Vital signs stable without fever or tachycardia.  We will go ahead and start on antibiotics, advised patient to go to emergency room if she has any worsening of symptoms or if she still has persistent pain in 24 hours. Discussed strict return precautions. Patient verbalized understanding and is agreeable with plan.   Final Clinical Impressions(s) / UC Diagnoses   Final diagnoses:  Dysuria  Right lower quadrant abdominal pain    ED Discharge Orders        Ordered    cephALEXin (KEFLEX) 500 MG capsule  4 times daily     03/02/17 1651       Controlled Substance Prescriptions [place] Controlled Substance Registry consulted? Not Applicable   Lew Dawes, [place] 03/02/17 1700

## 2017-03-02 NOTE — Discharge Instructions (Addendum)
Urine showed evidence of infection. We are treating you with Keflex. Be sure to take full course. Stay hydrated- urine should be pale yellow to clear. My continue azo for relief of burning while infection is being cleared.   We have also sent off a vaginal swab to check for yeast, BV, STDs.  Please go to emergency room if you have any worsening abdominal pain, or if this does not improve in the next 24 hours.  Please go to emergency room also if you develop any nausea, vomiting, inability to eat.  Please return or follow up with your primary provider if symptoms not improving with treatment. Please return sooner if you have worsening of symptoms or develop fever, nausea, vomiting, abdominal pain, back pain, lightheadedness, dizziness.

## 2017-03-03 LAB — CERVICOVAGINAL ANCILLARY ONLY
Bacterial vaginitis: POSITIVE — AB
CANDIDA VAGINITIS: NEGATIVE
CHLAMYDIA, DNA PROBE: NEGATIVE
NEISSERIA GONORRHEA: NEGATIVE
TRICH (WINDOWPATH): POSITIVE — AB

## 2017-03-04 ENCOUNTER — Telehealth (HOSPITAL_COMMUNITY): Payer: Self-pay | Admitting: Emergency Medicine

## 2017-03-04 MED ORDER — METRONIDAZOLE 500 MG PO TABS: 500.0000 mg | ORAL_TABLET | Freq: Two times a day (BID) | ORAL | 0 refills | Status: AC

## 2017-03-04 NOTE — Telephone Encounter (Signed)
Patient tested positive for Trichomonas and BV. Will send in metronidazole 500 mg BID x 7 days.Please call to inform, also advise to avoid alcohol use while on flagyl.   Patient treated for UTI with Keflex at visit. Culture not obtained. Please have her finish course and return if symptoms not improving.

## 2017-03-05 DIAGNOSIS — Z9189 Other specified personal risk factors, not elsewhere classified: Secondary | ICD-10-CM

## 2017-03-05 NOTE — Procedures (Signed)
    Patient Name: Betty Harris, Hazelene Study Date: 02/24/2017 Gender: Female D.O.B: [birth year] Age (years): 32 Referring Provider: Lizbeth BarkMandesia R Hairston FNP Height (inches): 66 Interpreting Physician: Jetty Duhamellinton Young MD, ABSM Weight (lbs): 245 RPSGT: Shelah LewandowskyGregory, [place] BMI: 40 MRN: [id number] Neck Size: 15.50 <br> <br> CLINICAL INFORMATION Sleep Study Type: NPSG  Indication for sleep study: Daytime Fatigue, Depression, Diabetes, Hypertension, Nocturnal Gasping, Obesity, Snoring  Epworth Sleepiness Score: 16  SLEEP STUDY TECHNIQUE As per the AASM Manual for the Scoring of Sleep and Associated Events v2.3 (April 2016) with a hypopnea requiring 4% desaturations.  The channels recorded and monitored were frontal, central and occipital EEG, electrooculogram (EOG), submentalis EMG (chin), nasal and oral airflow, thoracic and abdominal wall motion, anterior tibialis EMG, snore microphone, electrocardiogram, and pulse oximetry.  MEDICATIONS Medications self-administered by patient taken the night of the study : none reported  SLEEP ARCHITECTURE The study was initiated at 8:58:53 PM and ended at 4:32:43 AM.  Sleep onset time was 10.4 minutes and the sleep efficiency was 90.2%. The total sleep time was 409.5 minutes.  Stage REM latency was 71.0 minutes.  The patient spent 6.23% of the night in stage N1 sleep, 75.34% in stage N2 sleep, 1.22% in stage N3 and 17.22% in REM.  Alpha intrusion was absent.  Supine sleep was 41.78%.  RESPIRATORY PARAMETERS The overall apnea/hypopnea index (AHI) was 0.0 per hour. There were 0 total apneas, including 0 obstructive, 0 central and 0 mixed apneas. There were 0 hypopneas and 23 RERAs.  The AHI during Stage REM sleep was 0.0 per hour.  AHI while supine was 0.0 per hour.  The mean oxygen saturation was 97.22%. The minimum SpO2 during sleep was 94.00%.  moderate snoring was noted during this study.  CARDIAC DATA The 2 lead EKG demonstrated sinus  rhythm. The mean heart rate was 73.02 beats per minute. Other EKG findings include: None.  LEG MOVEMENT DATA The total PLMS were 0 with a resulting PLMS index of 0.00. Associated arousal with leg movement index was 0.0 .  IMPRESSIONS - No significant obstructive sleep apnea occurred during this study (AHI = 0.0/h). - No significant central sleep apnea occurred during this study (CAI = 0.0/h). - The patient had minimal or no oxygen desaturation during the study (Min O2 = 94.00%) - The patient snored with moderate snoring volume. - No cardiac abnormalities were noted during this study. - Clinically significant periodic limb movements did not occur during sleep. No significant associated arousals.  DIAGNOSIS - Primary Snoring  RECOMMENDATIONS - Be careful with alcohol, sedatives and other CNS depressants that may worsen sleep apnea and disrupt normal sleep architecture. - Sleep hygiene should be reviewed to assess factors that may improve sleep quality. - Weight management and regular exercise should be initiated or continued if appropriate.  [Electronically signed] 03/05/2017 03:50 PM  Jetty Duhamellinton Young MD, ABSM Diplomate, American Board of Sleep Medicine   NPI: [id number]                         Jetty Duhamellinton Young Diplomate, American Board of Sleep Medicine  ELECTRONICALLY SIGNED ON:  03/05/2017, 3:49 PM [facility] PH: [phone]   FX: [phone] ACCREDITED BY THE AMERICAN ACADEMY OF SLEEP MEDICINE

## 2017-04-05 ENCOUNTER — Ambulatory Visit: Payer: Medicaid Other | Admitting: Nurse Practitioner

## 2017-09-25 ENCOUNTER — Emergency Department (HOSPITAL_COMMUNITY): Payer: BLUE CROSS/BLUE SHIELD

## 2017-09-25 ENCOUNTER — Encounter (HOSPITAL_COMMUNITY): Payer: Self-pay | Admitting: Emergency Medicine

## 2017-09-25 ENCOUNTER — Emergency Department (HOSPITAL_COMMUNITY)
Admission: EM | Admit: 2017-09-25 | Discharge: 2017-09-25 | Disposition: A | Discharge status: Home / Self Care | Payer: BLUE CROSS/BLUE SHIELD | Attending: Emergency Medicine | Admitting: Emergency Medicine

## 2017-09-25 DIAGNOSIS — M7062 Trochanteric bursitis, left hip: Secondary | ICD-10-CM | POA: Diagnosis not present

## 2017-09-25 DIAGNOSIS — N390 Urinary tract infection, site not specified: Secondary | ICD-10-CM

## 2017-09-25 DIAGNOSIS — Y939 Activity, unspecified: Secondary | ICD-10-CM | POA: Insufficient documentation

## 2017-09-25 DIAGNOSIS — F17200 Nicotine dependence, unspecified, uncomplicated: Secondary | ICD-10-CM | POA: Diagnosis not present

## 2017-09-25 DIAGNOSIS — R109 Unspecified abdominal pain: Secondary | ICD-10-CM | POA: Diagnosis present

## 2017-09-25 DIAGNOSIS — M5442 Lumbago with sciatica, left side: Secondary | ICD-10-CM | POA: Diagnosis not present

## 2017-09-25 DIAGNOSIS — I2699 Other pulmonary embolism without acute cor pulmonale: Secondary | ICD-10-CM

## 2017-09-25 DIAGNOSIS — I10 Essential (primary) hypertension: Secondary | ICD-10-CM | POA: Diagnosis not present

## 2017-09-25 DIAGNOSIS — S39012A Strain of muscle, fascia and tendon of lower back, initial encounter: Secondary | ICD-10-CM

## 2017-09-25 LAB — URINALYSIS, ROUTINE W REFLEX MICROSCOPIC
BILIRUBIN URINE: NEGATIVE
Glucose, UA: NEGATIVE mg/dL
Ketones, ur: NEGATIVE mg/dL
LEUKOCYTES UA: NEGATIVE
Nitrite: POSITIVE — AB
PH: 7 (ref 5.0–8.0)
Protein, ur: NEGATIVE mg/dL
Specific Gravity, Urine: 1.018 (ref 1.005–1.030)

## 2017-09-25 LAB — COMPREHENSIVE METABOLIC PANEL
ALBUMIN: 3.9 g/dL (ref 3.5–5.0)
ALT: 18 U/L (ref 0–44)
ANION GAP: 7 (ref 5–15)
AST: 22 U/L (ref 15–41)
Alkaline Phosphatase: 54 U/L (ref 38–126)
BILIRUBIN TOTAL: 0.6 mg/dL (ref 0.3–1.2)
BUN: 14 mg/dL (ref 6–20)
CO2: 24 mmol/L (ref 22–32)
Calcium: 9 mg/dL (ref 8.9–10.3)
Chloride: 105 mmol/L (ref 98–111)
Creatinine, Ser: 0.84 mg/dL (ref 0.44–1.00)
GFR calc non Af Amer: >60 mL/min (ref >60)
Glucose, Bld: 92 mg/dL (ref 70–99)
POTASSIUM: 4 mmol/L (ref 3.5–5.1)
Sodium: 136 mmol/L (ref 135–145)
TOTAL PROTEIN: 7.3 g/dL (ref 6.5–8.1)

## 2017-09-25 LAB — CBC WITH DIFFERENTIAL/PLATELET
BASOS ABS: 0 10*3/uL (ref 0.0–0.1)
BASOS PCT: 1 %
Eosinophils Absolute: 0.4 10*3/uL (ref 0.0–0.7)
Eosinophils Relative: 6 %
HCT: 41.2 % (ref 36.0–46.0)
Hemoglobin: 13.4 g/dL (ref 12.0–15.0)
LYMPHS PCT: 36 %
Lymphs Abs: 2.2 10*3/uL (ref 0.7–4.0)
MCH: 25.6 pg — ABNORMAL LOW (ref 26.0–34.0)
MCHC: 32.5 g/dL (ref 30.0–36.0)
MCV: 78.8 fL (ref 78.0–100.0)
Monocytes Absolute: 0.5 10*3/uL (ref 0.1–1.0)
Monocytes Relative: 8 %
NEUTROS PCT: 49 %
Neutro Abs: 3 10*3/uL (ref 1.7–7.7)
Platelets: 319 10*3/uL (ref 150–400)
RBC: 5.23 MIL/uL — AB (ref 3.87–5.11)
RDW: 14.2 % (ref 11.5–15.5)
WBC: 6 10*3/uL (ref 4.0–10.5)

## 2017-09-25 LAB — POC URINE PREG, ED: Preg Test, Ur: NEGATIVE

## 2017-09-25 MED ORDER — ONDANSETRON HCL 4 MG/2ML IJ SOLN
4.0000 mg | Freq: Once | INTRAMUSCULAR | Status: AC
  Administered 2017-09-25: 4 mg via INTRAVENOUS
  Filled 2017-09-25: qty 2

## 2017-09-25 MED ORDER — RIVAROXABAN 20 MG PO TABS: 20.0000 mg | ORAL_TABLET | Freq: Every day | ORAL | 0 refills | Status: DC

## 2017-09-25 MED ORDER — SODIUM CHLORIDE 0.9 % IV BOLUS
1000.0000 mL | Freq: Once | INTRAVENOUS | Status: AC
Start: 2017-09-25 — End: 2017-09-25
  Administered 2017-09-25: 1000 mL via INTRAVENOUS

## 2017-09-25 MED ORDER — METHOCARBAMOL 500 MG PO TABS: 500.0000 mg | ORAL_TABLET | Freq: Three times a day (TID) | ORAL | 0 refills | Status: AC

## 2017-09-25 MED ORDER — HYDROMORPHONE HCL 1 MG/ML IJ SOLN
1.0000 mg | Freq: Once | INTRAMUSCULAR | Status: AC
  Administered 2017-09-25: 1 mg via INTRAVENOUS
  Filled 2017-09-25: qty 1

## 2017-09-25 MED ORDER — LORAZEPAM 2 MG/ML IJ SOLN
1.0000 mg | Freq: Once | INTRAMUSCULAR | Status: AC
  Administered 2017-09-25: 1 mg via INTRAVENOUS
  Filled 2017-09-25: qty 1

## 2017-09-25 MED ORDER — PREDNISONE 20 MG PO TABS: 20.0000 mg | ORAL_TABLET | Freq: Every day | ORAL | 0 refills | Status: AC

## 2017-09-25 MED ORDER — CEPHALEXIN 500 MG PO CAPS: 500.0000 mg | ORAL_CAPSULE | Freq: Two times a day (BID) | ORAL | 0 refills | Status: AC

## 2017-09-25 MED ORDER — MORPHINE SULFATE (PF) 4 MG/ML IV SOLN
4.0000 mg | Freq: Once | INTRAVENOUS | Status: AC
  Administered 2017-09-25: 4 mg via INTRAVENOUS
  Filled 2017-09-25: qty 1

## 2017-09-25 NOTE — ED Notes (Signed)
Patient transported to MRI 

## 2017-09-25 NOTE — ED Notes (Signed)
Called x 2 waiting for confirmation of message received.13:31

## 2017-09-25 NOTE — ED Notes (Signed)
MRI returned call to Dr Madilyn Hook.

## 2017-09-25 NOTE — ED Triage Notes (Signed)
Pt with L flank pain and pt states she has not been able to urinate this morning.

## 2017-09-25 NOTE — Discharge Instructions (Addendum)
Your imaging results showed that there was not any damage or injury to your spinal cord.   Your back pain is caused from muscle strain and it is causing impingement on your sciatic nerve which is why you have pain in your legs as well. For the back pain and sciatica, I have written you for the following: a steroid (Prednisone) for the inflammation from your muscle strain and a muscle relaxer (Robaxin) to help with muscle spasms. Follow-up with your PCP or orthopedics in 4-6 weeks if your back continues to cause you significant pain even after taking these medications.  Your urine showed that you have a UTI. I have prescribed an antibiotic, Cephalexin (Keflex) for you to take for 5 days. Take the full amount of this medication to make sure the infection is completely treated.  I have refilled your Xarelto for 2 weeks. It is very important that you schedule an appointment with a PCP soon to get this refilled and to re-establish primary medical care.  Follow-up to the emergency room sooner for the following issues: unable to hold your own urine or stool, numbness in your groin region or inability to walk. I have included information on sciatica which has other signs and symptoms that you should be aware of.  Thank you for allowing me to take care of you today. I wish you a speedy recovery.

## 2017-09-25 NOTE — ED Provider Notes (Signed)
[facility] EMERGENCY DEPARTMENT Provider Note  CSN: [id number] Arrival date & time: 09/25/17  8657   History   Chief Complaint Chief Complaint  Patient presents with  . Flank Pain    HPI Betty Harris is a 32 y.o. female with a medical history of PE, DVT and HTN who presented to the ED for left sided back pain x2 weeks. She describes intermittent aching left lower back pain that is worse with movement and lifting especially. Current pain is 8/10. Endorses that pain radiates to left leg and describes the pain as an aching, throbbing sensation. Denies fever, chills, weight loss, EtOH use, recent trauma/falls/injuries or IV drug use. Denies gait/coordination/balance issues, extremity paresthesias, weakness, bowel or bladder incontinence or saddle anesthesia. She also describes left lateral hip tenderness that is worse with palpation or laying on it. Patient's job requires her to do a lot of heavy lifting and bending and states that when pain began 2 weeks that it started after excessive physical activity at work. Patient states that she has not tried any medication prior to arrival.  Patient also describes acute onset urinary hesitancy this morning, but states that she was able to go to the bathroom without issue in the ED today. Endorses dysuria when urination. Denies vaginal pain, discharge, bleeding, urgency, frequency or hematuria.  Additional history obtained by medical chart. Patient was transferred from [facility] ED to obtain MRI imaging because previous provider had concerns for cauda equina.  Past Medical History:  Diagnosis Date  . History of blood clots   . Hypertension   . PTSD (post-traumatic stress disorder)     Patient Active Problem List   Diagnosis Date Noted  . History of hysterectomy 02/21/2017  . Breast mass in female 02/22/2013  . Pulmonary embolism (HCC) 02/19/2013  . Acute thromboembolism of deep veins of lower extremity (HCC) 10/30/2012    Past  Surgical History:  Procedure Laterality Date  . ABDOMINAL HYSTERECTOMY    . VENA CAVA FILTER PLACEMENT       OB History    Gravida  2   Para      Term      Preterm      AB  1   Living        SAB  1   TAB      Ectopic      Multiple      Live Births  1            Home Medications    Prior to Admission medications   Medication Sig Start Date End Date Taking? Authorizing Provider  aspirin-acetaminophen-caffeine (EXCEDRIN MIGRAINE) [id number] MG tablet Take 2 tablets by mouth every 6 (six) hours as needed for headache.   Yes [provider]  acetaminophen (TYLENOL) 500 MG tablet Take 2 tablets (1,000 mg total) by mouth every 8 (eight) hours as needed for moderate pain. Patient not taking: Reported on 09/25/2017 02/21/17   Lizbeth Bark, FNP  cephALEXin (KEFLEX) 500 MG capsule Take 1 capsule (500 mg total) by mouth 2 (two) times daily for 5 days. 09/25/17 09/30/17  Ryonna Cimini, Jerrel Ivory I, PA-C  methocarbamol (ROBAXIN) 500 MG tablet Take 1 tablet (500 mg total) by mouth 3 (three) times daily for 10 days. Prn muscle spasm 09/25/17 10/05/17  Kohei Antonellis, Jerrel Ivory I, PA-C  nicotine (NICODERM CQ - DOSED IN MG/24 HOURS) 14 mg/24hr patch Place 1 patch (14 mg total) onto the skin daily. Patient not taking: Reported on 09/25/2017 02/21/17  Lizbeth Bark, FNP  PARoxetine (PAXIL) 20 MG tablet Take 1 tablet (20 mg total) by mouth daily. Patient not taking: Reported on 09/25/2017 02/21/17   Lizbeth Bark, FNP  predniSONE (DELTASONE) 20 MG tablet Take 1 tablet (20 mg total) by mouth daily for 7 days. 09/25/17 10/02/17  Tibor Lemmons, Jerrel Ivory I, PA-C  rivaroxaban (XARELTO) 20 MG TABS tablet Take 1 tablet (20 mg total) by mouth daily with supper for 14 days. 09/25/17 10/09/17  Florabelle Cardin, Sharyon Medicus, PA-C    Family History Family History  Problem Relation Age of Onset  . Hypertension Mother   . Hypertension Father     Social History Social History   Tobacco Use  . Smoking status:  Current Every Day Smoker    Last attempt to quit: 01/12/2017    Years since quitting: 0.7  . Smokeless tobacco: Never Used  Substance Use Topics  . Alcohol use: Yes    Comment: OCC  . Drug use: No     Allergies   Patient has no known allergies.   Review of Systems Review of Systems  Constitutional: Negative for appetite change, chills, fever and unexpected weight change.  HENT: Negative.   Eyes: Negative.   Respiratory: Negative.   Cardiovascular: Negative.   Gastrointestinal: Negative.   Genitourinary: Positive for difficulty urinating and dysuria. Negative for decreased urine volume, flank pain, hematuria, urgency, vaginal bleeding, vaginal discharge and vaginal pain.  Musculoskeletal: Positive for back pain. Negative for arthralgias, gait problem, neck pain and neck stiffness.  Skin: Negative.   Neurological: Negative for tremors, weakness and numbness.  Hematological: Negative.    Physical Exam Updated Vital Signs BP 117/62 (BP Location: Left Arm)   Pulse 60   Temp 98.8 F (37.1 C) (Oral)   Resp 16   Ht 5\' 9"  (1.753 m)   Wt 115.2 kg   LMP 08/06/2010   SpO2 96%   BMI 37.51 kg/m   Physical Exam  Constitutional: Vital signs are normal. She appears well-developed and well-nourished. She is cooperative. She does not have a sickly appearance.  Obese. Laying on her right side as she states that is the most comfortable position.  HENT:  Head: Normocephalic and atraumatic.  Eyes: Pupils are equal, round, and reactive to light. Conjunctivae, EOM and lids are normal.  Neck: Normal range of motion and full passive range of motion without pain. Neck supple. No spinous process tenderness and no muscular tenderness present. Normal range of motion present.  Cardiovascular: Regular rhythm.  No murmur heard. Pulses:      Radial pulses are 2+ on the right side, and 2+ on the left side.       Dorsalis pedis pulses are 2+ on the right side, and 2+ on the left side.        Posterior tibial pulses are 2+ on the right side, and 2+ on the left side.  Pulmonary/Chest: Effort normal and breath sounds normal. She exhibits no tenderness.  Abdominal: Soft. Normal appearance and bowel sounds are normal. There is no tenderness. There is no CVA tenderness.  Musculoskeletal:       Right hip: Normal.       Left hip: She exhibits tenderness. She exhibits normal range of motion, normal strength, no bony tenderness and no deformity.       Right knee: Normal.       Left knee: Normal.       Cervical back: Normal.       Thoracic back: Normal.  Lumbar back: She exhibits decreased range of motion, tenderness and spasm. She exhibits no bony tenderness.  Negative straight leg raise bilaterally. Full ROM in lower extremities with 5/5 strength. Left hip bursa tenderness.  Neurological: She is alert.  Nursing note and vitals reviewed.  ED Treatments / Results  Labs (all labs ordered are listed, but only abnormal results are displayed) Labs Reviewed  URINALYSIS, ROUTINE W REFLEX MICROSCOPIC - Abnormal; Notable for the following components:      Result Value   APPearance HAZY (*)    Hgb urine dipstick SMALL (*)    Nitrite POSITIVE (*)    Bacteria, UA FEW (*)    All other components within normal limits  CBC WITH DIFFERENTIAL/PLATELET - Abnormal; Notable for the following components:   RBC 5.23 (*)    MCH 25.6 (*)    All other components within normal limits  URINE CULTURE  COMPREHENSIVE METABOLIC PANEL  POC URINE PREG, ED    EKG EKG Interpretation  Date/Time:  Sunday September 25 2017 10:57:01 EDT Ventricular Rate:  66 PR Interval:    QRS Duration: 83 QT Interval:  374 QTC Calculation: 392 R Axis:   76 Text Interpretation:  Sinus rhythm Baseline wander in lead(s) V6 Confirmed by Tilden Fossa [phone]) on 09/25/2017 11:15:23 AM   Radiology Mr Thoracic Spine Wo Contrast  Result Date: 09/25/2017 CLINICAL DATA:  Back pain, urinary difficulty, and bilateral lower  extremity paresthesia. EXAM: MRI THORACIC AND LUMBAR SPINE WITHOUT CONTRAST TECHNIQUE: Multiplanar and multiecho pulse sequences of the thoracic and lumbar spine were obtained without intravenous contrast. COMPARISON:  CT abdomen and pelvis 09/25/2017. Chest CTA 01/13/2017. FINDINGS: MRI THORACIC SPINE FINDINGS The study is mildly to moderately motion degraded. Alignment: Normal. Vertebrae: No fracture, suspicious osseous lesion, or significant marrow edema. Cord: No convincing cord signal abnormality identified within limitations of motion artifact. Paraspinal and other soft tissues: Gallstones. Disc levels: Within limitations of motion artifact, no sizable disc protrusion, spinal stenosis, or neural foraminal stenosis is identified. There is a shallow left paracentral disc protrusion/disc osteophyte complex at T1-2 which does not result in spinal stenosis or spinal cord mass effect. MRI LUMBAR SPINE FINDINGS Some sequences are mildly to moderately motion degraded. Segmentation: Standard. Alignment:  Normal. Vertebrae: No fracture, suspicious osseous lesion, or significant marrow edema. Conus medullaris and cauda equina: Conus extends to the T12-L1 level. Conus and cauda equina appear normal. Paraspinal and other soft tissues: Gallstones. Disc levels: Disc height and hydration are preserved throughout the lumbar spine with exception of minimal desiccation at L5-S1. There is mild facet hypertrophy at L4-5 and L5-S1 with associated facet joint effusions, largest on the left at L5-S1. No disc herniation or significant spinal or neural foraminal stenosis is identified. IMPRESSION: MR THORACIC SPINE IMPRESSION 1. Motion degraded examination. 2. Shallow left paracentral disc protrusion at T1-2.  No stenosis. 3. Normal appearance of the thoracic spinal cord within limitations of motion artifact. MR LUMBAR SPINE IMPRESSION 1. Motion degraded examination. 2. Mild facet hypertrophy at L4-5 and L5-S1. No disc herniation or  stenosis. Electronically Signed   By: Sebastian Ache M.D.   On: 09/25/2017 17:20   Mr Lumbar Spine Wo Contrast  Result Date: 09/25/2017 CLINICAL DATA:  Back pain, urinary difficulty, and bilateral lower extremity paresthesia. EXAM: MRI THORACIC AND LUMBAR SPINE WITHOUT CONTRAST TECHNIQUE: Multiplanar and multiecho pulse sequences of the thoracic and lumbar spine were obtained without intravenous contrast. COMPARISON:  CT abdomen and pelvis 09/25/2017. Chest CTA 01/13/2017. FINDINGS: MRI THORACIC SPINE FINDINGS  The study is mildly to moderately motion degraded. Alignment: Normal. Vertebrae: No fracture, suspicious osseous lesion, or significant marrow edema. Cord: No convincing cord signal abnormality identified within limitations of motion artifact. Paraspinal and other soft tissues: Gallstones. Disc levels: Within limitations of motion artifact, no sizable disc protrusion, spinal stenosis, or neural foraminal stenosis is identified. There is a shallow left paracentral disc protrusion/disc osteophyte complex at T1-2 which does not result in spinal stenosis or spinal cord mass effect. MRI LUMBAR SPINE FINDINGS Some sequences are mildly to moderately motion degraded. Segmentation: Standard. Alignment:  Normal. Vertebrae: No fracture, suspicious osseous lesion, or significant marrow edema. Conus medullaris and cauda equina: Conus extends to the T12-L1 level. Conus and cauda equina appear normal. Paraspinal and other soft tissues: Gallstones. Disc levels: Disc height and hydration are preserved throughout the lumbar spine with exception of minimal desiccation at L5-S1. There is mild facet hypertrophy at L4-5 and L5-S1 with associated facet joint effusions, largest on the left at L5-S1. No disc herniation or significant spinal or neural foraminal stenosis is identified. IMPRESSION: MR THORACIC SPINE IMPRESSION 1. Motion degraded examination. 2. Shallow left paracentral disc protrusion at T1-2.  No stenosis. 3. Normal  appearance of the thoracic spinal cord within limitations of motion artifact. MR LUMBAR SPINE IMPRESSION 1. Motion degraded examination. 2. Mild facet hypertrophy at L4-5 and L5-S1. No disc herniation or stenosis. Electronically Signed   By: Sebastian Ache M.D.   On: 09/25/2017 17:20   Ct Renal Stone Study  Result Date: 09/25/2017 CLINICAL DATA:  Left flank pain. Unable to urinate. Flank pain, stone disease suspected. EXAM: CT ABDOMEN AND PELVIS WITHOUT CONTRAST TECHNIQUE: Multidetector CT imaging of the abdomen and pelvis was performed following the standard protocol without IV contrast. COMPARISON:  None. FINDINGS: Lower chest: Mild dependent atelectasis is asymmetric on the right. Heart size is normal. No significant pleural or pericardial effusions are present. Hepatobiliary: Liver is unremarkable. Multiple noncalcified densities are present in the gallbladder, likely representing multiple stones. No inflammatory changes are present. The common bile duct is within normal limits. Pancreas: Unremarkable. No pancreatic ductal dilatation or surrounding inflammatory changes. Spleen: Normal in size without focal abnormality. Adrenals/Urinary Tract: Adrenal glands are normal bilaterally. Kidneys and ureters are unremarkable. There is stone or mass lesion. No obstructive uropathy is present. The urinary bladder is within limits. Stomach/Bowel: The stomach and duodenum are within normal limits. Small bowel is unremarkable. The terminal ileum is within normal limits. The appendix is visualized and normal. The ascending and transverse colon are within normal limits. The descending and sigmoid colon are unremarkable. Vascular/Lymphatic: IVC filter is in place. No significant aortic disease present. There is no significant adenopathy. Reproductive: Status post hysterectomy. No adnexal masses. Other: No abdominal wall hernia or abnormality. No abdominopelvic ascites. Musculoskeletal: Vertebral body heights alignment are  maintained. No focal lytic or blastic lesions are present. Bony pelvis intact. Hips are located within normal limits bilaterally. IMPRESSION: 1. No acute or focal abnormality to explain the patient's left-sided flank pain. 2. No nephrolithiasis or obstructive uropathy. 3. Cholelithiasis without evidence for cholecystitis. 4. Infrarenal IVC filter in situ. Electronically Signed   By: Marin Roberts M.D.   On: 09/25/2017 12:07    Procedures Procedures (including critical care time)  Medications Ordered in ED Medications  morphine 4 MG/ML injection 4 mg (4 mg Intravenous Given 09/25/17 1122)  ondansetron (ZOFRAN) injection 4 mg (4 mg Intravenous Given 09/25/17 1122)  HYDROmorphone (DILAUDID) injection 1 mg (1 mg Intravenous Given 09/25/17 1237)  sodium chloride 0.9 % bolus 1,000 mL (0 mLs Intravenous Stopped 09/25/17 1800)  LORazepam (ATIVAN) injection 1 mg (1 mg Intravenous Given 09/25/17 1551)   Initial Impression / Assessment and Plan / ED Course  Triage vital signs and the nursing notes have been reviewed.  Pertinent labs & imaging results that were available during care of the patient were reviewed and considered in medical decision making (see chart for details).  Patient presents from [facility] ED for reproducible back pain that has been present for 2 weeks. Pain began after excessive heavy lifting at work. Denies any recent falls or high impact trauma. Associated symptoms include muscle spasms and pain radiating to legs. However, there was concern by [facility] ED provider as patient had acute urinary complaints of hesitancy this morning and had additional concerns for acute spinal cord pathology. Patient denies pathologic s/s for back. Patient transferred for further imaging. Physical exam significant for bilateral low back pain with palpation and ROM (left > right). There is no midline tenderness, deformities or abnormal neuro findings on exam or in history to suggest an acute spinal cord or osseus pathology.  No systemic s/s to suggest underlying infectious or rheumatologic etiology.  Clinical Course as of Sep 25 2004  Wynelle Link Sep 25, 2017  1504 UA suggestive of UTI with positive nitrites and bacteria seen. Will send culture for further evaluation.   [GM]  1511 No nephrolithiasis or obstruction in urinary tract. No intra-abdominal findings to explain symptoms.   [GM]  1850 No spinal cord impingement or cauda equina seen with MRI of thoracic and lumbar spine. Disc heights preserved at all levels. Mild facet hypertrophy at L4-5 and L5-S1. Shallow left paracentral disc protrusion at T1-2.  No stenosis.   [GM]    Clinical Course User Index [GM] Idaly Verret, Sharyon Medicus, PA-C    Final Clinical Impressions(s) / ED Diagnoses  1. Low Back Pain with Sciatica. Rx for Prednisone 20mg  x7 days and Robaxin 500mg  TID for treatment. Education provided on OTC and supportive treatment for relief as well. Advised to follow-up with PCP if back pain persists > 4-6 weeks with conservative therapies. Education on s/s of spinal cord pathology that would warrant sooner follow-up. 2. Left Hip Bursitis. Physical exam significant for specific tenderness over left trochanteric bursa. Education provided on OTC and supportive treatment for relief. Advised to follow-up with PCP. 3. UTI. Rx for Keflex 500mg  BID x5 days.  Dispo: Home. After thorough clinical evaluation, this patient is determined to be medically stable and can be safely discharged with the previously mentioned treatment and/or outpatient follow-up/referral(s). At this time, there are no other apparent medical conditions that require further screening, evaluation or treatment.   Final diagnoses:  Urinary tract infection without hematuria, site unspecified  Trochanteric bursitis of left hip  Acute left-sided low back pain with left-sided sciatica    ED Discharge Orders         Ordered    rivaroxaban (XARELTO) 20 MG TABS tablet  Daily with supper     09/25/17 1900     cephALEXin (KEFLEX) 500 MG capsule  2 times daily     09/25/17 1900    methocarbamol (ROBAXIN) 500 MG tablet  3 times daily     09/25/17 1900    predniSONE (DELTASONE) 20 MG tablet  Daily     09/25/17 1900            Tarnesha Ulloa, Roanna Raider 09/25/17 Richelle Ito, MD 09/27/17 1001

## 2017-09-25 NOTE — ED Provider Notes (Addendum)
[facility]-EMERGENCY DEPT Provider Note   CSN: [id number] Arrival date & time: 09/25/17  0955     History   Chief Complaint Chief Complaint  Patient presents with  . Flank Pain    HPI Betty Harris is a 32 y.o. female.  The history is provided by the patient. No language interpreter was used.  Flank Pain    Betty Harris is a 32 y.o. female who presents to the Emergency Department complaining of flank pain. She presents to the emergency department complaining of left flank pain. The pain began about 2 to 3 weeks ago and is described as a constant left sided pain. The pain is worse with moving as well as breathing. Pain is worse laying on the left side and better at laying on her right side. Over the last few days the pain has significantly worsened. She had dysuria yesterday and today she had difficulty with urination. She has a history of PE and is supposed to be on Xarelto daily but has run out the medicine two months ago. She endorses three days of mild shortness of breath and cough as well. She denies any fevers, nausea, vomiting, abdominal pain, diarrhea. She does note tingling and bilateral feet. No known recent injuries. No similar symptoms in the past. Past Medical History:  Diagnosis Date  . History of blood clots   . Hypertension   . PTSD (post-traumatic stress disorder)     Patient Active Problem List   Diagnosis Date Noted  . History of hysterectomy 02/21/2017  . Breast mass in female 02/22/2013  . Pulmonary embolism (HCC) 02/19/2013  . Acute thromboembolism of deep veins of lower extremity (HCC) 10/30/2012    Past Surgical History:  Procedure Laterality Date  . ABDOMINAL HYSTERECTOMY    . VENA CAVA FILTER PLACEMENT       OB History    Gravida  2   Para      Term      Preterm      AB  1   Living        SAB  1   TAB      Ectopic      Multiple      Live Births  1            Home Medications    Prior to  Admission medications   Medication Sig Start Date End Date Taking? Authorizing Provider  aspirin-acetaminophen-caffeine (EXCEDRIN MIGRAINE) [phone] MG tablet Take 2 tablets by mouth every 6 (six) hours as needed for headache.   Yes [provider]  acetaminophen (TYLENOL) 500 MG tablet Take 2 tablets (1,000 mg total) by mouth every 8 (eight) hours as needed for moderate pain. Patient not taking: Reported on 09/25/2017 02/21/17   Lizbeth Bark, FNP  methocarbamol (ROBAXIN) 500 MG tablet Take 1 tablet (500 mg total) by mouth 3 (three) times daily. Prn muscle spasm Patient not taking: Reported on 09/25/2017 01/19/17   Anders Simmonds, PA-C  nicotine (NICODERM CQ - DOSED IN MG/24 HOURS) 14 mg/24hr patch Place 1 patch (14 mg total) onto the skin daily. Patient not taking: Reported on 09/25/2017 02/21/17   Lizbeth Bark, FNP  PARoxetine (PAXIL) 20 MG tablet Take 1 tablet (20 mg total) by mouth daily. Patient not taking: Reported on 09/25/2017 02/21/17   Lizbeth Bark, FNP  rivaroxaban (XARELTO) 20 MG TABS tablet Take 1 tablet (20 mg total) by mouth daily with supper. Patient not taking: Reported  on 09/25/2017 01/19/17   Anders Simmonds, PA-C  Rivaroxaban 15 & 20 MG TBPK Take as directed on package: Start with one 15mg  tablet by mouth twice a day with food. On Day 22, switch to one 20mg  tablet once a day with food. Patient not taking: Reported on 01/20/2017 01/13/17   Eyvonne Mechanic, PA-C    Family History Family History  Problem Relation Age of Onset  . Hypertension Mother   . Hypertension Father     Social History Social History   Tobacco Use  . Smoking status: Current Every Day Smoker    Last attempt to quit: 01/12/2017    Years since quitting: 0.7  . Smokeless tobacco: Never Used  Substance Use Topics  . Alcohol use: Yes    Comment: OCC  . Drug use: No     Allergies   Patient has no known allergies.   Review of Systems Review of Systems  Genitourinary:  Positive for flank pain.  All other systems reviewed and are negative.    Physical Exam Updated Vital Signs BP 123/64 (BP Location: Left Arm)   Pulse 80   Temp 98.8 F (37.1 C) (Oral)   Resp 17   Ht 5\' 9"  (1.753 m)   Wt 115.2 kg   LMP 08/06/2010   SpO2 100%   BMI 37.51 kg/m   Physical Exam  Constitutional: She is oriented to person, place, and time. She appears well-developed and well-nourished.  HENT:  Head: Normocephalic and atraumatic.  Cardiovascular: Normal rate and regular rhythm.  No murmur heard. Pulmonary/Chest: Effort normal and breath sounds normal. No respiratory distress.  Abdominal: Soft. There is no tenderness. There is no rebound and no guarding.  Mild left cva tenderness  Musculoskeletal: She exhibits no edema or tenderness.  2+ DP pulses bilaterally  Neurological: She is alert and oriented to person, place, and time.  5/5 strength in BLE with sensation to light touch intact in BLE  Skin: Skin is warm and dry.  Psychiatric: She has a normal mood and affect. Her behavior is normal.  Nursing note and vitals reviewed.    ED Treatments / Results  Labs (all labs ordered are listed, but only abnormal results are displayed) Labs Reviewed  URINALYSIS, ROUTINE W REFLEX MICROSCOPIC - Abnormal; Notable for the following components:      Result Value   APPearance HAZY (*)    Hgb urine dipstick SMALL (*)    Nitrite POSITIVE (*)    Bacteria, UA FEW (*)    All other components within normal limits  CBC WITH DIFFERENTIAL/PLATELET - Abnormal; Notable for the following components:   RBC 5.23 (*)    MCH 25.6 (*)    All other components within normal limits  URINE CULTURE  COMPREHENSIVE METABOLIC PANEL  POC URINE PREG, ED    EKG EKG Interpretation  Date/Time:  Sunday September 25 2017 10:57:01 EDT Ventricular Rate:  66 PR Interval:    QRS Duration: 83 QT Interval:  374 QTC Calculation: 392 R Axis:   76 Text Interpretation:  Sinus rhythm Baseline wander  in lead(s) V6 Confirmed by Tilden Fossa [phone]) on 09/25/2017 11:15:23 AM   Radiology Ct Renal Stone Study  Result Date: 09/25/2017 CLINICAL DATA:  Left flank pain. Unable to urinate. Flank pain, stone disease suspected. EXAM: CT ABDOMEN AND PELVIS WITHOUT CONTRAST TECHNIQUE: Multidetector CT imaging of the abdomen and pelvis was performed following the standard protocol without IV contrast. COMPARISON:  None. FINDINGS: Lower chest: Mild dependent atelectasis is asymmetric  on the right. Heart size is normal. No significant pleural or pericardial effusions are present. Hepatobiliary: Liver is unremarkable. Multiple noncalcified densities are present in the gallbladder, likely representing multiple stones. No inflammatory changes are present. The common bile duct is within normal limits. Pancreas: Unremarkable. No pancreatic ductal dilatation or surrounding inflammatory changes. Spleen: Normal in size without focal abnormality. Adrenals/Urinary Tract: Adrenal glands are normal bilaterally. Kidneys and ureters are unremarkable. There is stone or mass lesion. No obstructive uropathy is present. The urinary bladder is within limits. Stomach/Bowel: The stomach and duodenum are within normal limits. Small bowel is unremarkable. The terminal ileum is within normal limits. The appendix is visualized and normal. The ascending and transverse colon are within normal limits. The descending and sigmoid colon are unremarkable. Vascular/Lymphatic: IVC filter is in place. No significant aortic disease present. There is no significant adenopathy. Reproductive: Status post hysterectomy. No adnexal masses. Other: No abdominal wall hernia or abnormality. No abdominopelvic ascites. Musculoskeletal: Vertebral body heights alignment are maintained. No focal lytic or blastic lesions are present. Bony pelvis intact. Hips are located within normal limits bilaterally. IMPRESSION: 1. No acute or focal abnormality to explain the patient's  left-sided flank pain. 2. No nephrolithiasis or obstructive uropathy. 3. Cholelithiasis without evidence for cholecystitis. 4. Infrarenal IVC filter in situ. Electronically Signed   By: Marin Roberts M.D.   On: 09/25/2017 12:07    Procedures Procedures (including critical care time)  Medications Ordered in ED Medications  morphine 4 MG/ML injection 4 mg (4 mg Intravenous Given 09/25/17 1122)  ondansetron (ZOFRAN) injection 4 mg (4 mg Intravenous Given 09/25/17 1122)  HYDROmorphone (DILAUDID) injection 1 mg (1 mg Intravenous Given 09/25/17 1237)  sodium chloride 0.9 % bolus 1,000 mL (1,000 mLs Intravenous New Bag/Given 09/25/17 1326)  LORazepam (ATIVAN) injection 1 mg (1 mg Intravenous Given 09/25/17 1551)     Initial Impression / Assessment and Plan / ED Course  I have reviewed the triage vital signs and the nursing notes.  Pertinent labs & imaging results that were available during my care of the patient were reviewed by me and considered in my medical decision making (see chart for details).  Clinical Course as of Sep 28 1710  Wynelle Link Sep 25, 2017  1504 UA suggestive of UTI with positive nitrites and bacteria seen. Will send culture for further evaluation.   [GM]  1511 No nephrolithiasis or obstruction in urinary tract. No intra-abdominal findings to explain symptoms.   [GM]  1850 No spinal cord impingement or cauda equina seen with MRI of thoracic and lumbar spine. Disc heights preserved at all levels. Mild facet hypertrophy at L4-5 and L5-S1. Shallow left paracentral disc protrusion at T1-2.  No stenosis.   [GM]    Clinical Course User Index [GM] Mortis, Sharyon Medicus, PA-C    Patient with history of PE here for evaluation of thoracic and lumbar back pain. She does endorse paresthesias to bilateral feet as well as difficulty with urination. Neurologically she is intact on examination. Given her symptoms concern for possible cauda equina, plan to obtain MRI. MRI is not available at this  facility, plan to transfer to [facility] for further imaging. Discussed with Dr. Jeraldine Loots, who accepts the patient and transfer. Current presentation is not consistent with acute pulmonary embolism. No evidence of obstructing stone. UA is not consistent with UTI, nitrates are positive but no other findings concerning for UTI - will send a culture and only treat if positive. Patient will need a prescription for Xarelto  if she is to be discharged home.  Final Clinical Impressions(s) / ED Diagnoses   Final diagnoses:  None    ED Discharge Orders    None       Tilden Fossa, MD 09/25/17 1650    Tilden Fossa, MD 09/27/17 1712

## 2017-09-27 LAB — URINE CULTURE

## 2017-10-20 ENCOUNTER — Telehealth: Payer: Self-pay | Admitting: Hematology and Oncology

## 2017-10-20 ENCOUNTER — Encounter: Payer: Self-pay | Admitting: Hematology and Oncology

## 2017-10-20 NOTE — Telephone Encounter (Signed)
Pt has been scheduled to see Dr. Caron Presume on 10/9 at [facility] for a hematology appt. Pt aware to arrive 30 minutes early. Letter mailed.

## 2017-10-26 ENCOUNTER — Encounter: Payer: Self-pay | Admitting: Hematology and Oncology

## 2017-10-26 ENCOUNTER — Telehealth: Payer: Self-pay

## 2017-10-26 ENCOUNTER — Inpatient Hospital Stay: Payer: BLUE CROSS/BLUE SHIELD

## 2017-10-26 ENCOUNTER — Inpatient Hospital Stay: Payer: BLUE CROSS/BLUE SHIELD | Attending: Hematology and Oncology | Admitting: Hematology and Oncology

## 2017-10-26 VITALS — BP 137/69 | HR 71 | Temp 98.0°F | Resp 18 | Ht 69.0 in | Wt 247.3 lb

## 2017-10-26 DIAGNOSIS — I10 Essential (primary) hypertension: Secondary | ICD-10-CM | POA: Insufficient documentation

## 2017-10-26 DIAGNOSIS — F17200 Nicotine dependence, unspecified, uncomplicated: Secondary | ICD-10-CM | POA: Diagnosis not present

## 2017-10-26 DIAGNOSIS — Z7982 Long term (current) use of aspirin: Secondary | ICD-10-CM | POA: Diagnosis not present

## 2017-10-26 DIAGNOSIS — Z7901 Long term (current) use of anticoagulants: Secondary | ICD-10-CM | POA: Insufficient documentation

## 2017-10-26 DIAGNOSIS — R351 Nocturia: Secondary | ICD-10-CM | POA: Insufficient documentation

## 2017-10-26 DIAGNOSIS — Z79899 Other long term (current) drug therapy: Secondary | ICD-10-CM | POA: Diagnosis not present

## 2017-10-26 DIAGNOSIS — O88212 Thromboembolism in pregnancy, second trimester: Secondary | ICD-10-CM

## 2017-10-26 DIAGNOSIS — Z8249 Family history of ischemic heart disease and other diseases of the circulatory system: Secondary | ICD-10-CM | POA: Insufficient documentation

## 2017-10-26 DIAGNOSIS — D6851 Activated protein C resistance: Secondary | ICD-10-CM | POA: Insufficient documentation

## 2017-10-26 DIAGNOSIS — R350 Frequency of micturition: Secondary | ICD-10-CM

## 2017-10-26 DIAGNOSIS — N390 Urinary tract infection, site not specified: Secondary | ICD-10-CM

## 2017-10-26 DIAGNOSIS — Z86718 Personal history of other venous thrombosis and embolism: Secondary | ICD-10-CM | POA: Insufficient documentation

## 2017-10-26 DIAGNOSIS — O2232 Deep phlebothrombosis in pregnancy, second trimester: Secondary | ICD-10-CM

## 2017-10-26 DIAGNOSIS — G8929 Other chronic pain: Secondary | ICD-10-CM | POA: Diagnosis not present

## 2017-10-26 DIAGNOSIS — Z86711 Personal history of pulmonary embolism: Secondary | ICD-10-CM | POA: Diagnosis not present

## 2017-10-26 LAB — URINALYSIS, COMPLETE (UACMP) WITH MICROSCOPIC
Bilirubin Urine: NEGATIVE
Glucose, UA: NEGATIVE mg/dL
KETONES UR: 20 mg/dL — AB
Leukocytes, UA: NEGATIVE
Nitrite: NEGATIVE
Protein, ur: NEGATIVE mg/dL
Specific Gravity, Urine: 1.029 (ref 1.005–1.030)
pH: 5 (ref 5.0–8.0)

## 2017-10-26 LAB — D-DIMER, QUANTITATIVE: D-Dimer, Quant: 0.44 ug/mL-FEU (ref 0.00–0.50)

## 2017-10-26 NOTE — Telephone Encounter (Signed)
Printed avs and calender of upcoming appointment. Per 10/9 los 

## 2017-10-26 NOTE — Progress Notes (Signed)
[facility] Outpatient Hematology/Oncology Initial Consultation  Patient Name:  Betty Harris  DOB: 1985-05-26   Date of Service: October 26, 2017  Referring Provider:  Tobie Lords, PA-C [address] [place], [place] [address]   Consulting Physician: Toni Arthurs, MD Hematology/Oncology  Reason for Referral: In the setting of a previous deep vein thrombosis and pulmonary embolism in the second trimester, second pregnancy in 2007; treated initially with warfarin and subsequently rivaroxaban, she presents now for further discussion regarding the need for long-term anticoagulation.  History Present Illness: Betty Harris is a 32 year old gravida 2 para 1 miscarriage 1 resident of [place] whose past medical history is significant for recurrent major depressive disorder; generalized anxiety disorder; a large benign left breast mass; status post biopsy; posttraumatic stress syndrome; elevated BMI; primary hypertension; previous deep vein thrombosis and pulmonary embolism in 2007, currently on rivaroxaban: 20 mg once daily; status post placement of an IVC filter in 2007 and subsequent TAH-BSO; migraine headaches; and lower back pain and paresthesias felt to be associated with sciatica; and recent urinary tract infection treated with cephalexin through the emergency department on September 10.  Her primary care provider is Tobie Lords, nurse practitioner.  In 2007 while residing in [place], in her second trimester, second pregnancy, she developed progressive pain and tenderness within the left lower extremitover over a 2-week period, followed by "chest tightness" and exertional dyspnea on an acute basis. Although those records are not available for review, she was started on low molecular weight heparin and continued on low molecular weight heparin following her unsuccessful delivery.  She cannot recall how long she was on enoxaparin.  She recalls however she was  subsequently changed to warfarin.  She continued on warfarin for 1 year, and then stopped due to insurance constraints.  She was lost to follow-up.  When she relocated to [facility], she was seen and evaluated in 2016 at the [facility], a free medical clinic.  Because of her past history, she was started on rivaroxaban and continued on rivaroxaban for an indefinite but short period.  In 2017 after relocating to [facility], she was seen and evaluated at the emergency department at [facility].  She complained at that time of intense left leg pain, described as throbbing. Both duplex venous Dopplers and a CT thoracic angiogram failed to reveal any evidence of active thromboembolic disease.  At the time, she had restarted rivaroxaban because she had insurance.  Her maternal grandmother had both a pulmonary embolism and myocardial infarction.  She died at the age of 10 years.  There is no other known history of peripheral arterial or venous thromboembolic disease within the immediate family.  She has had no complications on rivaroxaban.  She denies any bleeding tendency.  She has no diabetes mellitus, coronary artery disease, or cardiac dysrhythmia.  She denies seizure disorder or stroke syndrome.  She has no thyroid disease.  There is no gastroesophageal reflux or peptic ulcer disease. On September 10 she was seen and evaluated through the emergency department at [facility] complaining of worsening lower back and radiating pain to the left hip.  A urinalysis and urine culture and sensitivity identified revealed an escherichia coli urinary tract infection treated with cephalexin: 500 mg twice daily for 5 days.  Her pain has subsequently improved.  She reports no viral hepatitis, inflammatory bowel disease, or diverticulosis.  She has chronic lower back pain and stiffness in her hands.  She denies rheumatoid or gouty arthritis.  She  has occasional migraine headaches.  While her appetite is fair her  weight remains stable.  She has no rash or itching.  There is no unusual headache, dizziness, lightheadedness, syncope, or near syncopal episodes.  She has no unusual cough, sore throat, or orthopnea.  She denies dyspnea at rest.  She has increasing exertional dyspnea over the past 3 months.  There is no pain or difficulty in swallowing.  No fever, shaking chills, sweats, or flulike symptoms are reported.  She has no nausea, vomiting, diarrhea, or constipation.  She generally moves her bowels twice daily.  She denies melena or bright red blood per rectum.    She complains of urinary frequency and 3 times nightly nocturia over the past several weeks.  There is no hematuria or dysuria.  At the time of her prior urinary tract infection she had retention and burning on urination.  It is with this background she presents now for further diagnostic and therapeutic recommendations in the setting of previous pulmonary embolism and left lower extremity deep vein thrombosis in 2007 at the time of her second trimester, second pregnancy as outlined above.  Past Medical History:  Diagnosis Date  . History of blood clots   . Hypertension   . PTSD (post-traumatic stress disorder)    Past Surgical History:  Procedure Laterality Date  . ABDOMINAL HYSTERECTOMY    . VENA CAVA FILTER PLACEMENT     Gynecologic History: Her menarche was at age 63 years. Her last menses was in May 2006. No hormone replacement therapy. No prior oral contraception.  She is a gravida 2 para 1 miscarriage 1. Her first pregnancy was at age 54 years. Her last pregnancy was at age 12 years. Her most recent diagnostic mammogram/ultrasound was January 20, 2017. She has a large radiographic mass in the left breast. She had a breast biopsy which was benign. Her last Pap smear was in 2007.  Family History  Problem Relation Age of Onset  . Hypertension Mother   . Hypertension Father   Mother: Age 8 years: No major medical  problems. Father: Age 80 years: HTN/brain tumor. Brothers (4): Asthma/allergic rhinitis Sisters (3): No major medical problems No history of coronary artery disease, peripheral vascular disease, or venous thromboembolic disease in the immediate family.  Social History   Socioeconomic History  . Marital status: Single    Spouse name: Not on file  . Number of children: Not on file  . Years of education: Not on file  . Highest education level: Not on file  Occupational History  . Not on file  Social Needs  . Financial resource strain: Not on file  . Food insecurity:    Worry: Not on file    Inability: Not on file  . Transportation needs:    Medical: Not on file    Non-medical: Not on file  Tobacco Use  . Smoking status: Current Every Day Smoker    Last attempt to quit: 01/12/2017    Years since quitting: 0.7  . Smokeless tobacco: Never Used  Substance and Sexual Activity  . Alcohol use: Yes    Comment: OCC  . Drug use: No  . Sexual activity: Yes    Birth control/protection: None  Lifestyle  . Physical activity:    Days per week: 7 days    Minutes per session: 20 min  . Stress: Rather much  Relationships  . Social connections:    Talks on phone: More than three times a week  Gets together: Never    Attends religious service: Never    Active member of club or organization: No    Attends meetings of clubs or organizations: Never    Relationship status: Separated  . Intimate partner violence:    Fear of current or ex partner: No    Emotionally abused: No    Physically abused: No    Forced sexual activity: No  Other Topics Concern  . Not on file  Social History Narrative  . Not on file  Shanti is married for the past 3 years. She is a Production designer, theatre/television/film at [employer]. She has 1 son with bronchial asthma at age 15 years.   Her alcohol intake in the past consisted of wine twice weekly, now 9 She reports no recreational drug use. She began smoking at the age of 15  years. She smoked <1/2 pack of cigarettes daily. She stopped smoking in 2018.  Transfusion History: She was transfused in 2007 during her delivery.  Exposure History: She has no known exposure to toxic chemicals, radiation, or pesticides.  Allergies:  She has no known medical allergies No food or seasonal allergies  Current Outpatient Medications on File Prior to Visit  Medication Sig  . acetaminophen (TYLENOL) 500 MG tablet Take 2 tablets (1,000 mg total) by mouth every 8 (eight) hours as needed for moderate pain. (Patient not taking: Reported on 09/25/2017)  . aspirin-acetaminophen-caffeine (EXCEDRIN MIGRAINE) 250-250-65 MG tablet Take 2 tablets by mouth every 6 (six) hours as needed for headache.  . nicotine (NICODERM CQ - DOSED IN MG/24 HOURS) 14 mg/24hr patch Place 1 patch (14 mg total) onto the skin daily. (Patient not taking: Reported on 09/25/2017)  . PARoxetine (PAXIL) 20 MG tablet Take 1 tablet (20 mg total) by mouth daily. (Patient not taking: Reported on 09/25/2017)  . rivaroxaban (XARELTO) 20 MG TABS tablet Take 1 tablet (20 mg total) by mouth daily with supper for 14 days.   No current facility-administered medications on file prior to visit.     Review of Systems: Constitutional: No fever, sweats, or shaking chills.  No appetite or weight deficit; elevated BMI. Skin: No rash, scaling, sores, lumps, or jaundice. HEENT: No visual changes or hearing deficit. Pulmonary: No unusual cough, sore throat, or orthopnea; DOE. Breasts: No complaints. Cardiovascular: No coronary artery disease, angina, or myocardial infarction.  No cardiac dysrhythmia, or dyslipidemia.  Primary hypertension. Gastrointestinal: No indigestion, dysphagia, abdominal pain, diarrhea, or constipation.  No change in bowel habits. Genitourinary: She has urinary frequency, nocturia and urgency.  Prior UTI in September.  No hematuria, or dysuria. Musculoskeletal: Chronic lower back and left hip pain; bilateral  stiffness in her hands over the past 5 years; no new arthralgias or myalgias; no joint swelling, pain, or instability. Hematologic: No bleeding tendency or easy bruisability. Endocrine: No intolerance to heat or cold; no thyroid disease or diabetes mellitus. Vascular: No peripheral arterial; venous thromboembolic disease during pregnancy/PE/DVT. Psychological: No anxiety and depression; posttraumatic stress syndrome.  No new mood changes. Neurological: No dizziness, lightheadedness, syncope, or near syncopal episodes; no numbness or tingling in the fingers or toes.  Physical Examination: Vital Signs: Body surface area is 2.34 meters squared.  Vitals:   10/26/17 0852  BP: 137/69  Pulse: 71  Resp: 18  Temp: 98 F (36.7 C)    Filed Weights   10/26/17 0852  Weight: 247 lb 4.8 oz (112.2 kg)  ECOG PERFORMANCE STATUS: 1 Constitutional:  Effie Gillihan is a fully nourished and developed African-American albeit  overweight.  She looks age appropriate.  She is friendly and cooperative without respiratory compromise at rest. Skin: No rashes, scaling, dryness, jaundice, or itching. HEENT: Head is normocephalic and atraumatic.  Pupils are equal round and reactive to light and accommodation.  Sclerae are anicteric.  Conjunctivae are pink.  No sinus tenderness nor oropharyngeal lesions.  Lips without cracking or peeling; tongue without mass, inflammation, or nodularity.  Mucous membranes are moist. Neck: Supple and symmetric.  No jugular venous distention or thyromegaly.  Trachea is midline. Lymphatics: No cervical or supraclavicular lymphadenopathy.  No epitrochlear, axillary, or inguinal lymphadenopathy is appreciated. Respiratory/chest: Thorax is symmetrical.  Breath sounds are clear to auscultation and percussion.  Normal excursion and respiratory effort. Back: Symmetric without deformity or tenderness. Cardiovascular: Heart rate and rhythm are regular without murmurs. Gastrointestinal: Abdomen is  soft, nontender; no organomegaly.  Bowel sounds are normoactive.  No masses are appreciated. Rectal examination: Not performed. Extremities: In the lower extremities, there is no asymmetric swelling, erythema, tenderness, or cord formation.  No clubbing, cyanosis, nor edema. Hematologic: No petechiae, hematomas, or ecchymoses. Psychological:  She is oriented to person, place, and time; normal affect. Neurological: There are no gross neurologic deficits.  Laboratory Results: I have reviewed the data as listed: CBC Latest Ref Rng & Units 09/25/2017 01/13/2017 06/02/2015  WBC 4.0 - 10.5 K/uL 6.0 8.2 8.7  Hemoglobin 12.0 - 15.0 g/dL 74.2 59.5 63.8  Hematocrit 36.0 - 46.0 % 41.2 39.0 42.0  Platelets 150 - 400 K/uL 319 308 309    CMP Latest Ref Rng & Units 09/25/2017 01/13/2017 06/02/2015  Glucose 70 - 99 mg/dL 92 71 91  BUN 6 - 20 mg/dL 14 15 14   Creatinine 0.44 - 1.00 mg/dL 7.56 4.33 2.95  Sodium 135 - 145 mmol/L 136 137 140  Potassium 3.5 - 5.1 mmol/L 4.0 3.6 3.9  Chloride 98 - 111 mmol/L 105 106 106  CO2 22 - 32 mmol/L 24 24 27   Calcium 8.9 - 10.3 mg/dL 9.0 1.8(A) 9.7  Total Protein 6.5 - 8.1 g/dL 7.3 - -  Total Bilirubin 0.3 - 1.2 mg/dL 0.6 - -  Alkaline Phos 38 - 126 U/L 54 - -  AST 15 - 41 U/L 22 - -  ALT 0 - 44 U/L 18 - -   Diagnostic/Imaging Studies: ADDENDUM: 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 This was found to be concordant by Dr. Mayford Knife. Pathology results were discussed with the patient by telephone. The patient reported doing well after the biopsy with tenderness at the site. Post biopsy instructions and care were reviewed and questions were answered. The patient was encouraged to call [facility] for any additional concerns. The patient was instructed to continue with monthly self breast examinations, clinical follow-up as needed, and to return for annual mammography at 42. The patient  was informed a reminder notice would be sent regarding this appointment.  Gerome Sam III M.D  01/24/2017 15:01  ULTRASOUND OF THE LEFT BREAST  COMPARISON:  Prior chest CTs dated 01/13/2017, 06/02/2015 and 08/30/2014.  FINDINGS: Physical examination of the left breast reveals a large firm slightly mobile mass at the approximate 11 to 12 o'clock position. The patient is extremely tender to palpation of this mass. The patient states this mass causes constant pain.  Targeted ultrasound of the left breast was performed demonstrating an oval well-circumscribed hypoechoic mass at 11 o'clock 4 cm from nipple measuring 5.1 x 1.9 x 3.8 cm.  This corresponds with the mass seen in the left breast on prior chest CTs. No lymphadenopathy seen in the left axilla.  IMPRESSION: Left breast mass demonstrating imaging features most suggestive of a fibroadenoma, however given large size (reportedly increasing in size per recent chest CT) as well as extreme tenderness ultrasound-guided biopsy is warranted.  RECOMMENDATION: Ultrasound-guided biopsy of the palpable left breast mass. This is being scheduled for the patient.  BI-RADS CATEGORY  4: Suspicious.  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 This was found to be concordant by Dr. Mayford Knife, pathology.   Edwin Cap M.D. 01/20/2017 15:13  March 03, 2017 Bacterial vaginitis **POSITIVE for Gardnerella vaginalis**Abnormal    Comment: Normal Reference Range - Negative  Candida vaginitis Negative for Candida species   Comment: Normal Reference Range - Negative  Chlamydia Negative   Comment: Normal Reference Range - Negative  Neisseria gonorrhea Negative   Comment: Normal Reference Range - Negative  Trichomonas **POSITIVE**Abnormal    Comment: Normal Reference Range - Negative  Resulting Agency [facility]      Specimen  Collected: 03/02/17 00:00  Last Resulted: 03/03/17 00:00     [facility] February 28, 2013 ULTRASOUND LEFT BREAST, Feb 28, 2013 01:31:00 PM . INDICATION: 611.72 Breast mass in female left breast mass incidentally identified on recent chest CT. Patient is 32 years old. COMPARISON: None . TECHNIQUE: Targeted breast ultrasonography was performed. At the 11:00 position, 3 finger breaths from the nipple in the region where a mass was identified on a recent chest CT. Marland Kitchen FINDINGS: .A discrete palpable mass could not be appreciated on physical examination. Marland Kitchen Ultrasound: However on ultrasound at the 11:00 position there is a large solid smoothly marginated hypoechoic mass with enhanced through transmission measuring at least 3.5 cm most consistent with a fibroadenoma.  . Additional comments: None . CONCLUSION: Since this is a nonpalpable incidental finding identified in a 32 year old with imaging characteristics consistent with a fibroadenoma, management should be on a clinical basis. BI-RADS category 2.  Summary/Assessment: In the setting of a previous deep vein thrombosis and pulmonary embolism in the second trimester, second pregnancy in 2007; treated initially with warfarin and subsequently rivaroxaban, she presents now for further discussion regarding the need for long-term anticoagulation.  In 2007 while residing in [place], in the second trimester, second pregnancy, she developed progressive pain and tenderness within the left lower extremitover over a 2-week period, followed by "chest tightness" and exertional dyspnea on an acute basis. Although those records are not available for review, she was started on low molecular weight heparin and continued on low molecular weight heparin following her unsuccessful delivery.  She cannot recall how long she was on enoxaparin.  She recalls however she was subsequently changed to warfarin.  She continued on warfarin for 1 year,  and then stopped due to insurance constraints.  She was lost to follow-up.  When she relocated to [facility], she was seen and evaluated in 2016 at the [facility], a free medical clinic.  Because of her past history, she was started on rivaroxaban and continued on rivaroxaban for an indefinite but short period.  In 2017 after relocating to [facility], she was seen and evaluated at the emergency department at [facility].  She complained at that time of intense left leg pain, described as throbbing. Both duplex venous Dopplers and a CT thoracic angiogram failed to reveal any evidence of active thromboembolic disease.  At  the time, she had restarted rivaroxaban because she had insurance.  Her maternal grandmother had both a pulmonary embolism and myocardial infarction.  She died at the age of 10 years.  There is no other known history of peripheral arterial or venous thromboembolic disease within the immediate family.  She has no diabetes mellitus, coronary artery disease, or cardiac dysrhythmia.  She denies seizure disorder or stroke syndrome.  She has no thyroid disease.  There is no gastroesophageal reflux or peptic ulcer disease.  On September 10 she was seen and evaluated through the emergency department at [facility] complaining of worsening lower back and radiating pain to the left hip.  A urinalysis and urine culture and sensitivity identified revealed an escherichia coli urinary tract infection, treated with cephalexin: 500 mg twice daily for 5 days.  Her pain has subsequently improved.  She reports no viral hepatitis, inflammatory bowel disease, or diverticulosis.  She has chronic lower back pain and stiffness in her hands.  She denies rheumatoid or gouty arthritis.  She has occasional migraine headaches.  While her appetite is fair her weight remains stable.  She has no rash or itching.  There is no unusual headache, dizziness, lightheadedness, syncope, or near syncopal episodes.  She has  no unusual cough, sore throat, or orthopnea.  She denies dyspnea at rest.  She has increasing exertional dyspnea over the past 3 months.  There is no pain or difficulty in swallowing.  No fever, shaking chills, sweats, or flulike symptoms are reported.  She has no nausea, vomiting, diarrhea, or constipation.  She generally moves her bowels twice daily.  She denies melena or bright red blood per rectum.  She complains of urinary frequency and 3 times nightly nocturia over the past several weeks.  There is no hematuria or dysuria.  At the time of her prior urinary tract infection she had retention and burning on urination.    Her other comorbid problems include recurrent major depressive disorder; generalized anxiety disorder; posttraumatic stress syndrome; elevated BMI; primary hypertension; large benign left breast mass; status post biopsy; previous deep vein thrombosis and pulmonary embolism in 2007, currently on rivaroxaban: 20 mg once daily; status post placement of an IVC filter in 2007 and subsequent TAH-BSO; migraine headaches; and lower back pain and paresthesias felt to be associated with sciatica; and recent urinary tract infection treated with cephalexin through the emergency department on September 10.   Recommendation/Plan: We discussed in lengthy detail her prior history with both pulmonary embolism and left lower extremity deep vein thrombosis in 2007 at the time of her second pregnancy, second trimester in 2007.  Although she has had no ill effects from rivaroxaban, she wants to consider discontinuing it if possible.  Following her initial episode she has had no recurrence while on anticoagulation.  The only active provocative contributing factor is her excess weight.  It was explained to her that while on rivaroxaban, only a limited hypercoagulable work-up can be obtained.  She wishes to go forward with that modified evaluation.    Laboratory studies were obtained for factor V Leiden gene  mutation, prothrombin gene mutation, homocysteIne, and antiphospholipid antibodies.  After she receives these results, she will decide whether or not she wants to discontinue rivaroxaban in favor of observation.  This decision will be discussed with her primary care provider Tobie Lords.  It was emphasized that the greatest risk factor for a pulmonary embolism/DVT is a prior thromboembolic event.  Even in the absence of a hereditary/acquired protein  deficiency or gene mutation, her risk is greater than the average population.  The value of a inferior vena cava filter this far from its initial placement, is questionable.  These filters tend to thrombose and can become dislodged or ineffective.  It is unclear if she had a removable or permanent IVC filter.  My suspicion is that it was a permanent filter.  In the first year after placement of an IVC filter.  The IVC filters have clearly been shown to reduce the risk of recurrent pulmonary embolism especially in the first year or 2 after placement.  There is no protection however against a lower extremity deep vein thrombosis and possible lower extremity postphlebitic syndrome.  At the present time she has no left lower extremity swelling or pain.  Questions were answered to her satisfaction.  If she should choose to discontinue rivaroxaban, she will need to be off of that drug for at least 2-3 weeks before she can be tested for protein S, protein C, antithrombin III, and lupus anticoagulant.  Because of her body habitus and continued urinary frequency/urgency, a urinalysis and culture and sensitivity if indicated was requested.  She was advised to call her office for those results within 48 hours unless notified sooner.  Although unrelated to her history of venous thromboembolic disease, it is recommended that a surveillance mammogram and/or ultrasound be obtained at least every 2 years because of the size of this breast mass, felt to be benign.  This should  be discussed with the imaging center.  Questions were answered to her satisfaction.  Barring any unforeseen complications, her next scheduled doctor visit to discuss the preliminary lab results is on October 29.  In the interim she should continue rivaroxaban.  She was advised to call us if any new or untoward problems arise.  The total time spent discussing her extensive history, methodology for evaluating a hereditary/acquired thrombophilia, the role, rationale, and limitation of the IVC filter, preliminary considerations and recommendations was 60 minutes.  At least 50% of that time was spent in discussion, reviewing outside records, laboratory evaluation, counseling, and answering questions. All questions were answered to her satisfaction.  This note was dictated using voice activated technology/software.  Unfortunately, typographical errors are not uncommon, and transcription is subject to mistakes and regrettably misinterpretation.  If necessary, clarification of the above information can be discussed with me at any time.  Thank you Lauren for allowing my participation in the care of Jazelle Mierzejewski. I will keep you closely informed as the results of her preliminary laboratory data become available.  Please do not hesitate to call should any questions arise regarding this initial consultation and discussion.  FOLLOW UP: AS DIRECTED   cc:      Tobie Lords, PA-C  Toni Arthurs, MD  Hematology/Oncology [facility] [address] [place], [place] [address] Office: [phone] Main: [phone]

## 2017-10-26 NOTE — Patient Instructions (Addendum)
We discussed in detail your prior history with both pulmonary embolism and left lower extremity deep vein thrombosis in the second trimester of your second pregnancy in 2007.  No primary documentation is available on the existing chart.  Because you are currently on Xarelto, complete testing for a hereditary/acquired predisposition is not feasible.  We did discuss gene mutation testing which can be done on some of the potential hereditary factors that would put you at increased risk.  They can be done while on Xarelto.  We discussed also that the presence of an IVC filter over time, does not provide complete protection against a recurrent pulmonary embolism.  It provides no protection against a recurrent lower extremity deep vein thrombosis.  There is no primary documentation to identify what type of filter, whether removable or permanent was placed.  Even with a permanent IVC filter, there is an increasing risk of "failure" over time.    At your request, a battery of laboratory studies were performed to identify a potential hereditary predisposition.  They are not affected by Xarelto.  The final decision regarding discontinuing Xarelto should be made in partnership with your primary care provider.  Although treated recently for a urinary tract infection, you are continuing to have frequent urination both in the daytime at night.  For that reason, a urine specimen was requested.  Those results should be available within 48 hours.  Please call our office for those results unless notified sooner.  Barring any unforeseen complications, your next scheduled doctor visit is on October 29.  Please do not hesitate to call if any questions arise in the interim.  Thank you!

## 2017-10-27 ENCOUNTER — Other Ambulatory Visit: Payer: Self-pay | Admitting: Hematology and Oncology

## 2017-10-27 DIAGNOSIS — R8290 Unspecified abnormal findings in urine: Secondary | ICD-10-CM

## 2017-10-27 LAB — BETA-2-GLYCOPROTEIN I ABS, IGG/M/A: Beta-2 Glyco I IgG: <9 GPI IgG units (ref 0–20)

## 2017-10-27 LAB — ANTIPHOSPHOLIPID SYNDROME PROF
ANTICARDIOLIPIN IGM: 11 [MPL'U]/mL (ref 0–12)
Anticardiolipin IgG: <9 GPL U/mL (ref 0–14)
DRVVT: 39.1 s (ref 0.0–47.0)
PTT Lupus Anticoagulant: 35.8 s (ref 0.0–51.9)

## 2017-10-27 LAB — HOMOCYSTEINE: HOMOCYSTEINE-NORM: 7.9 umol/L (ref 0.0–15.0)

## 2017-10-31 LAB — PROTHROMBIN GENE MUTATION

## 2017-11-15 ENCOUNTER — Encounter: Payer: Self-pay | Admitting: Hematology and Oncology

## 2017-11-15 ENCOUNTER — Inpatient Hospital Stay: Payer: BLUE CROSS/BLUE SHIELD

## 2017-11-15 ENCOUNTER — Inpatient Hospital Stay (HOSPITAL_BASED_OUTPATIENT_CLINIC_OR_DEPARTMENT_OTHER): Payer: BLUE CROSS/BLUE SHIELD | Admitting: Hematology and Oncology

## 2017-11-15 ENCOUNTER — Telehealth: Payer: Self-pay | Admitting: Hematology and Oncology

## 2017-11-15 VITALS — BP 119/78 | HR 77 | Temp 98.9°F | Resp 18 | Ht 69.0 in | Wt 248.3 lb

## 2017-11-15 DIAGNOSIS — F17200 Nicotine dependence, unspecified, uncomplicated: Secondary | ICD-10-CM | POA: Diagnosis not present

## 2017-11-15 DIAGNOSIS — G8929 Other chronic pain: Secondary | ICD-10-CM

## 2017-11-15 DIAGNOSIS — O88212 Thromboembolism in pregnancy, second trimester: Secondary | ICD-10-CM

## 2017-11-15 DIAGNOSIS — Z86718 Personal history of other venous thrombosis and embolism: Secondary | ICD-10-CM

## 2017-11-15 DIAGNOSIS — Z86711 Personal history of pulmonary embolism: Secondary | ICD-10-CM

## 2017-11-15 DIAGNOSIS — Z7901 Long term (current) use of anticoagulants: Secondary | ICD-10-CM

## 2017-11-15 DIAGNOSIS — D6851 Activated protein C resistance: Secondary | ICD-10-CM | POA: Diagnosis not present

## 2017-11-15 DIAGNOSIS — R351 Nocturia: Secondary | ICD-10-CM

## 2017-11-15 DIAGNOSIS — Z8249 Family history of ischemic heart disease and other diseases of the circulatory system: Secondary | ICD-10-CM

## 2017-11-15 DIAGNOSIS — O2232 Deep phlebothrombosis in pregnancy, second trimester: Secondary | ICD-10-CM

## 2017-11-15 DIAGNOSIS — Z7982 Long term (current) use of aspirin: Secondary | ICD-10-CM

## 2017-11-15 DIAGNOSIS — N390 Urinary tract infection, site not specified: Secondary | ICD-10-CM

## 2017-11-15 DIAGNOSIS — Z79899 Other long term (current) drug therapy: Secondary | ICD-10-CM

## 2017-11-15 DIAGNOSIS — I10 Essential (primary) hypertension: Secondary | ICD-10-CM

## 2017-11-15 LAB — ANTITHROMBIN III: AntiThromb III Func: 107 % (ref 75–120)

## 2017-11-15 NOTE — Patient Instructions (Signed)
We discussed in detail the results of your preliminary hypercoagulable evaluation.  Those gene mutation studies and factor assays failed to reveal any hereditary/acquired predisposition to thrombosis.  Because you have since discontinued rivaroxaban, the vitamin K factors will be performed for completion.  Barring any unforeseen complications, your next scheduled doctor visit to discuss those results is on November 13.  Please do not hesitate to call should any questions arise in the interim.

## 2017-11-15 NOTE — Telephone Encounter (Signed)
Gave pt avs and calendar  °

## 2017-11-15 NOTE — Progress Notes (Signed)
[facility] Hematology/Oncology Outpatient Progress Note  Patient Name:  Betty Harris  DOB: [birth year]   Date of Service: November 15, 2017  Referring Provider: Tobie Lords, PA-C [address] [place], [place] [address]   Consulting Physician: Toni Arthurs, MD Hematology/Oncology  Reason for Visit: In the setting of a previous deep vein thrombosis and pulmonary embolism in the second trimester, second pregnancy in 2007; treated initially with warfarin and subsequently rivaroxaban, she presents now for the results of the laboratory studies and recommendations, now after discontinuation of rivaroxaban on October 9.  Brief History: Betty Harris is a 32 year old gravida 2 para 1 miscarriage 1 resident of [place] whose past medical history is significant for recurrent major depressive disorder; generalized anxiety disorder; a large benign left breast mass; status post biopsy; posttraumatic stress syndrome; elevated BMI; primary hypertension; previous deep vein thrombosis and pulmonary embolism in 2007, currently on rivaroxaban: 20 mg once daily; status post placement of an IVC filter in 2007 and subsequent TAH-BSO; migraine headaches; and lower back pain and paresthesias felt to be associated with sciatica; and recent urinary tract infection treated with cephalexin through the emergency department on September 10.  Her primary care provider is Tobie Lords, nurse practitioner.  In 2007 while residing in [place], in her second trimester, second pregnancy, she developed progressive pain and tenderness within the left lower extremitover over a 2-week period, followed by "chest tightness" and exertional dyspnea on an acute basis. Although those records are not available for review, she was started on low molecular weight heparin and continued on low molecular weight heparin following her unsuccessful delivery.  She cannot recall how long she was on enoxaparin.  She  recalls however she was subsequently changed to warfarin.  She continued on warfarin for 1 year, and then stopped due to insurance constraints.  She was lost to follow-up.  When she relocated to [facility], she was seen and evaluated in 2016 at the [facility], a free medical clinic.  Because of her past history, she was started on rivaroxaban and continued on rivaroxaban for an indefinite but short period.  In 2017 after relocating to [facility], she was seen and evaluated at the emergency department at [facility].  She complained at that time of intense left leg pain, described as throbbing. Both duplex venous Dopplers and a CT thoracic angiogram failed to reveal any evidence of active thromboembolic disease.  At the time, she had restarted rivaroxaban because she had insurance.    At the time of our first visit, we discussed in lengthy detail her prior history with both pulmonary embolism and left lower extremity deep vein thrombosis in 2007 at the time of her second pregnancy, second trimester in 2007.  Although she has had no ill effects from rivaroxaban, she discontinued it on October 9 following her initial visit. Following her initial episode she has had no recurrence while on anticoagulation.  To date, the only provocative contributing factor is her excess weight.  She weighs 248 pounds. Her maternal grandmother had both a pulmonary embolism and myocardial infarction.  She died at the age of 13 years.  There is no other known history of peripheral arterial or venous thromboembolic disease within the immediate family.  We reviewed, the questionable value of a inferior vena cava filter this far from its initial placement, is questionable.  These filters tend to thrombose and can become dislodged or ineffective.  It is unclear if she had a removable or permanent IVC  filter.  My suspicion is that it was a permanent filter.  In the first year after placement of an IVC filter. The IVC filters  have clearly been shown to reduce the risk of recurrent pulmonary embolism especially in the first year or 2 after placement. There is no protection however against a lower extremity deep vein thrombosis.  She has had no complications on rivaroxaban. On September 10 she was seen and evaluated through the emergency department at [facility] complaining of worsening lower back and radiating pain to the left hip.  A urinalysis and urine culture and sensitivity identified revealed an escherichia coli urinary tract infection treated with cephalexin: 500 mg twice daily for 5 days. Her pain has subsequently improved. It is with this background she presents now for laboratory studies and reevaluation following discontinuation of rivaroxaban on October 9 as outlined above.    Interval History: In the interim since her last visit, she denies any new problems or complaints. While her appetite is fair her weight remains stable.  She has no rash or itching.  There is no unusual headache, dizziness, lightheadedness, syncope, or near syncopal episodes.  She has no unusual cough, sore throat, or orthopnea.  She denies dyspnea at rest.  She has increasing exertional dyspnea over the past 3 months. There is no pain or difficulty in swallowing.  No fever, shaking chills, sweats, or flulike symptoms are reported.  She has no nausea, vomiting, diarrhea, or constipation.  She generally moves her bowels twice daily.  She denies melena or bright red blood per rectum. She has no urinary frequency, urgency, hematuria, or dysuria. She has chronic lower back pain and stiffness in her hands.  She denies rheumatoid or gouty arthritis.  She has occasional migraine headaches.  Past Medical History Reviewed        Family History Reviewed       Social History Reviewed  Past Medical History:  Diagnosis Date  . History of blood clots   . Hypertension   . PTSD (post-traumatic stress disorder)         Past Surgical History:   Procedure Laterality Date  . ABDOMINAL HYSTERECTOMY    . VENA CAVA FILTER PLACEMENT     Allergies:  She has no known medical allergies No food or seasonal allergies  Current Outpatient Medications on File Prior to Visit  Medication Sig  . methocarbamol (ROBAXIN) 500 MG tablet Take 500 mg by mouth 3 (three) times daily as needed for muscle spasms.    No current facility-administered medications on file prior to visit.     Review of Systems: Constitutional: No fever, sweats, or shaking chills.  No appetite or weight deficit; elevated BMI. Skin: No rash, scaling, sores, lumps, or jaundice. HEENT: No visual changes or hearing deficit. Pulmonary: No unusual cough, sore throat, or orthopnea; DOE. Breasts: No complaints. Cardiovascular: No coronary artery disease, angina, or myocardial infarction.  No cardiac dysrhythmia, or dyslipidemia.  Primary hypertension. Gastrointestinal: No indigestion, dysphagia, abdominal pain, diarrhea, or constipation.  No change in bowel habits. Genitourinary:  No urinary frequency, urgency, hematuria, or dysuria.   Musculoskeletal: Chronic lower back and left hip pain; bilateral stiffness in her hands over the past 5 years; no new arthralgias or myalgias; no joint swelling, pain, or instability. Hematologic: No bleeding tendency or easy bruisability. Endocrine: No intolerance to heat or cold; no thyroid disease or diabetes mellitus. Vascular: No peripheral arterial; venous thromboembolic disease during pregnancy/PE/DVT. Psychological: No anxiety and depression; posttraumatic stress syndrome.  No new mood changes. Neurological: No dizziness, lightheadedness, syncope, or near syncopal episodes; no numbness or tingling in the fingers or toes.  Physical Examination: Vital Signs: Body surface area is 2.34 meters squared.  Vitals:   11/15/17 1013  BP: 119/78  Pulse: 77  Resp: 18  Temp: 98.9 F (37.2 C)  SpO2: 93%    Filed Weights   11/15/17 1013   Weight: 248 lb 4.8 oz (112.6 kg)  Body mass index is 36.67 kg/m. ECOG PERFORMANCE STATUS: 1 Constitutional:  Rihana Krawczyk is a fully nourished and developed African-American albeit overweight.  She looks age appropriate.  She is friendly and cooperative without respiratory compromise at rest. Skin: No rashes, scaling, dryness, jaundice, or itching. HEENT: Head is normocephalic and atraumatic.  Pupils are equal round and reactive to light and accommodation.  Sclerae are anicteric.  Conjunctivae are pink.  No sinus tenderness nor oropharyngeal lesions.  Lips without cracking or peeling; tongue without mass, inflammation, or nodularity.  Mucous membranes are moist. Neck: Supple and symmetric.  No jugular venous distention or thyromegaly.  Trachea is midline. Lymphatics: No cervical or supraclavicular lymphadenopathy.  No epitrochlear, axillary, or inguinal lymphadenopathy is appreciated. Respiratory/chest: Thorax is symmetrical.  Breath sounds are clear to auscultation and percussion.  Normal excursion and respiratory effort. Back: Symmetric without deformity or tenderness. Cardiovascular: Heart rate and rhythm are regular without murmurs. Gastrointestinal: Abdomen is soft, nontender; no organomegaly.  Bowel sounds are normoactive.  No masses are appreciated. Rectal examination: Not performed. Extremities: In the lower extremities, there is no asymmetric swelling, erythema, tenderness, or cord formation.  No clubbing, cyanosis, nor edema. Hematologic: No petechiae, hematomas, or ecchymoses. Psychological:  She is oriented to person, place, and time; normal affect. Neurological: There are no gross neurologic deficits.  Laboratory Results:I have reviewed the data as listed: CBC Latest Ref Rng & Units 09/25/2017 01/13/2017 06/02/2015  WBC 4.0 - 10.5 K/uL 6.0 8.2 8.7  Hemoglobin 12.0 - 15.0 g/dL 91.4 78.2 95.6  Hematocrit 36.0 - 46.0 % 41.2 39.0 42.0  Platelets 150 - 400 K/uL 319 308 309     CMP Latest Ref Rng & Units 09/25/2017 01/13/2017 06/02/2015  Glucose 70 - 99 mg/dL 92 71 91  BUN 6 - 20 mg/dL 14 15 14   Creatinine 0.44 - 1.00 mg/dL 2.13 0.86 5.78  Sodium 135 - 145 mmol/L 136 137 140  Potassium 3.5 - 5.1 mmol/L 4.0 3.6 3.9  Chloride 98 - 111 mmol/L 105 106 106  CO2 22 - 32 mmol/L 24 24 27   Calcium 8.9 - 10.3 mg/dL 9.0 4.6(N) 9.7  Total Protein 6.5 - 8.1 g/dL 7.3 - -  Total Bilirubin 0.3 - 1.2 mg/dL 0.6 - -  Alkaline Phos 38 - 126 U/L 54 - -  AST 15 - 41 U/L 22 - -  ALT 0 - 44 U/L 18    On October 26, 2017: Homocysteine 7.9  Ref Range & Units 2wk ago  Beta-2 Glyco I IgG 0 - 20 GPI IgG units <9   Comment: (NOTE)  The reference interval reflects a 3SD or 99th percentile interval,  which is thought to represent a potentially clinically significant  result in accordance with the International Consensus Statement on  the classification criteria for definitive antiphospholipid  syndrome (APS). J Thromb Haem 2006;4:295-306.   Beta-2-Glycoprotein I IgM 0 - 32 GPI IgM units <9   Comment: (NOTE)  The reference interval reflects a 3SD or 99th percentile interval,  which is thought to represent a  potentially clinically significant  result in accordance with the International Consensus Statement on  the classification criteria for definitive antiphospholipid  syndrome (APS). J Thromb Haem 2006;4:295-306.  Performed At: [facility]  [address] [place], [place] [id number]  Jolene Schimke MD ZH:[id number]   Beta-2-Glycoprotein I IgA 0 - 25 GPI IgA units <9   Comment: (NOTE)  The reference interval reflects a 3SD or 99th percentile interval,  which is thought to represent a potentially clinically significant  result in accordance with the International Consensus Statement on  the classification criteria for definitive antiphospholipid  syndrome (APS). J Thromb Haem 2006;4:295-306.     Ref Range & Units 2wk ago  Anticardiolipin IgG 0 - 14 GPL U/mL <9    Comment: (NOTE)              Negative:       <15              Indeterminate:   15 - 20              Low-Med Positive: >20 - 80              High Positive:     >80   Anticardiolipin IgM 0 - 12 MPL U/mL 11   Comment: (NOTE)              Negative:       <13              Indeterminate:   13 - 20              Low-Med Positive: >20 - 80              High Positive:     >80   PTT Lupus Anticoagulant 0.0 - 51.9 sec 35.8   DRVVT 0.0 - 47.0 sec 39.1   Lupus Anticoag Interp  Comment:   Comment: (NOTE)  No lupus anticoagulant was detected.  Performed At: [facility]  [address] 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 This was found to be concordant by Dr. Mayford Knife. Pathology results were discussed with the patient by telephone. The patient reported doing well after the biopsy with tenderness at the site. Post biopsy instructions and care were reviewed and questions were answered. The patient was encouraged to call [facility] for any additional concerns. The patient was instructed to continue with monthly self breast examinations, clinical follow-up as needed, and to return for annual mammography at 42. The patient was informed a reminder notice would be sent regarding this appointment.  Gerome Sam III M.D 01/24/2017 15:01  ULTRASOUND OF THE LEFT BREAST  COMPARISON: Prior chest CTs dated 01/13/2017, 06/02/2015 and 08/30/2014.  FINDINGS: Physical examination of the left breast reveals a large firm slightly mobile mass at the approximate 11 to  12 o'clock position. The patient is extremely tender to palpation of this mass. The patient states this mass causes constant pain.  Targeted ultrasound of the left breast was performed demonstrating an oval well-circumscribed hypoechoic mass at 11 o'clock 4 cm from nipple measuring 5.1 x 1.9 x 3.8 cm. This corresponds with the mass seen in the left breast on prior chest CTs. No lymphadenopathy seen in the left axilla.  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  RECOMMENDATION: Ultrasound-guided biopsy of the palpable left breast mass. This is being scheduled for the patient.  BI-RADS CATEGORY 4: Suspicious.  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 This was found to be concordant by Dr. Mayford Knife, pathology.   Edwin Cap M.D. 01/20/2017 15:13  March 03, 2017 Bacterial vaginitis **POSITIVE for Gardnerella vaginalis**Abnormal    Comment: Normal Reference Range - Negative  Candida vaginitis Negative for Candida species   Comment: Normal Reference Range - Negative  Chlamydia Negative   Comment: Normal Reference Range - Negative  Neisseria gonorrhea Negative   Comment: Normal Reference Range - Negative  Trichomonas **POSITIVE**Abnormal    Comment: Normal Reference Range - Negative  Resulting Agency [facility]      Specimen Collected: 03/02/17 00:00  Last Resulted: 03/03/17 00:00     [facility] February 28, 2013 ULTRASOUND LEFT BREAST, Feb 28, 2013 01:31:00 PM . INDICATION: 611.72 Breast mass in female left breast mass incidentally identified on recent chest CT. Patient is 32 years old. COMPARISON: None . TECHNIQUE: Targeted breast  ultrasonography was performed. At the 11:00 position, 3 finger breaths from the nipple in the region where a mass was identified on a recent chest CT. Marland Kitchen FINDINGS: .A discrete palpable mass could not be appreciated on physical examination. Marland Kitchen Ultrasound: However on ultrasound at the 11:00 position there is a large solid smoothly marginated hypoechoic mass with enhanced through transmission measuring at least 3.5 cm most consistent with a fibroadenoma.  . Additional comments: None . CONCLUSION: Since this is a nonpalpable incidental finding identified in a 32 year old with imaging characteristics consistent with a fibroadenoma, management should be on a clinical basis. BI-RADS category 2.  Summary/Assessment: In the setting of a previous deep vein thrombosis and pulmonary embolism in the second trimester, second pregnancy in 2007; treated initially with warfarin and subsequently rivaroxaban, she presents now for the results of the laboratory studies and recommendations, now after discontinuation of rivaroxaban on October 9.  In 2007 while residing in [place], in her second trimester, second pregnancy, she developed progressive pain and tenderness within the left lower extremitover over a 2-week period, followed by "chest tightness" and exertional dyspnea on an acute basis. Although those records are not available for review, she was started on low molecular weight heparin and continued on low molecular weight heparin following her unsuccessful delivery.  She cannot recall how long she was on enoxaparin. She recalls however she was subsequently changed to warfarin.  She continued on warfarin for 1 year, and then stopped due to insurance constraints.  She was lost to follow-up.  When she relocated to [facility], she was seen and evaluated in 2016 at the [facility], a free medical clinic.  Because of her past history, she was started on rivaroxaban and continued on rivaroxaban for an  indefinite but short period.  In 2017 after relocating to [facility], she was seen and evaluated at the emergency department at [facility].  She complained at that time of intense left leg pain, described as throbbing. Both duplex venous Dopplers and a CT thoracic angiogram failed to reveal any evidence of active thromboembolic disease.  At the time, she had restarted rivaroxaban because she had insurance.    At the time of our first visit, we discussed  in lengthy detail her prior history with both pulmonary embolism and left lower extremity deep vein thrombosis in 2007 at the time of her second pregnancy, second trimester in 2007.  Although she has had no ill effects from rivaroxaban, she wanted to discontinue it.  Although not expressly recommended, she discontinued rivaroxaban on October 9. Following her initial episode she has had no recurrence while on anticoagulation. To date, the only provocative contributing factor is her excess weight.  She weighs 248 pounds. Her maternal grandmother had both a pulmonary embolism and myocardial infarction.  She died at the age of 91 years.  There is no other known history of peripheral arterial or venous thromboembolic disease within the immediate family.  We reviewed, the questionable value of a inferior vena cava filter this far from its initial placement, is questionable.  These filters tend to thrombose and can become dislodged or ineffective.  It is unclear if she had a removable or permanent IVC filter.  My suspicion is that it was a permanent filter.  In the first year after placement of an IVC filter. The IVC filters have clearly been shown to reduce the risk of recurrent pulmonary embolism especially in the first year or 2 after placement. There is no protection however against a lower extremity deep vein thrombosis.  In the interim since her last visit, she denies any new problems or complaints. While her appetite is fair her weight remains stable.  She has  no rash or itching.  There is no unusual headache, dizziness, lightheadedness, syncope, or near syncopal episodes.  She has no unusual cough, sore throat, or orthopnea.  She denies dyspnea at rest.  She has increasing exertional dyspnea over the past 3 months. There is no pain or difficulty in swallowing.  No fever, shaking chills, sweats, or flulike symptoms are reported.  She has no nausea, vomiting, diarrhea, or constipation.  She generally moves her bowels twice daily.  She denies melena or bright red blood per rectum. She has no urinary frequency, urgency, hematuria, or dysuria. She has chronic lower back pain and stiffness in her hands.  She denies rheumatoid or gouty arthritis.  She has occasional migraine headaches.  Her other comorbid problems include recurrent major depressive disorder; generalized anxiety disorder; a large benign left breast mass; status post biopsy; posttraumatic stress syndrome; elevated BMI; primary hypertension; previous deep vein thrombosis and pulmonary embolism in 2007, currently on rivaroxaban: 20 mg once daily; status post placement of an IVC filter in 2007 and subsequent TAH-BSO; migraine headaches; and lower back pain and paresthesias felt to be associated with sciatica; and recent urinary tract infection treated with cephalexin through the emergency department on September 10.  Recommendation/Plan: We discussed in detail the results of her preliminary hypercoagulable evaluation while on rivaroxaban.  Although requested, factor V Leiden gene mutation is still pending. The remainder of the hypercoagulable work-up did not reveal any hereditary/acquired thrombophilia.  Now that she has since discontinued rivaroxaban, the vitamin K factors and lupus anticoagulant can be performed without biochemical interference.  Although unrelated to her history of venous thromboembolic disease, it is recommended that a surveillance mammogram and/or ultrasound be obtained at least every  2 years because of the size of this breast mass, felt to be benign.  This should be discussed with the Leotis Shames, her primary care provider, and the Imaging Center.  Barring any unforeseen complications, her next scheduled doctor visit to discuss those results is on November 13.  The total time spent discussing  the results of her preliminary laboratory evaluation for a hereditary/acquired thrombophilia, the role, rationale, and limitation of the IVC filter, and preliminary considerations were 25 minutes.  At least 50% of that time was spent in face-to-face discussion, counseling, and answering questions. All questions were answered to her satisfaction.  This note was dictated using voice activated technology/software.  Unfortunately, typographical errors are not uncommon, and transcription is subject to mistakes and regrettably misinterpretation.  If necessary, clarification of the above information can be discussed with me at any time.  FOLLOW UP: AS DIRECTED   cc:      Tobie Lords, PA-C   Toni Arthurs, MD  Hematology/Oncology [facility] [address] [place], [place] [address] Office: [phone] Main: [phone]

## 2017-11-16 LAB — FACTOR 8 ASSAY: Coagulation Factor VIII: 150 % — ABNORMAL HIGH (ref 56–140)

## 2017-11-16 LAB — PROTEIN S, TOTAL: Protein S Ag, Total: 82 % (ref 60–150)

## 2017-11-16 LAB — PROTEIN C ACTIVITY: Protein C Activity: 120 % (ref 73–180)

## 2017-11-17 LAB — PROTEIN C, TOTAL: Protein C, Total: 98 % (ref 60–150)

## 2017-11-21 LAB — FACTOR 5 LEIDEN

## 2017-11-30 ENCOUNTER — Ambulatory Visit: Payer: BLUE CROSS/BLUE SHIELD | Admitting: Hematology and Oncology

## 2017-12-21 ENCOUNTER — Encounter (HOSPITAL_COMMUNITY): Payer: Self-pay | Admitting: Emergency Medicine

## 2017-12-21 ENCOUNTER — Emergency Department (HOSPITAL_COMMUNITY)
Admission: EM | Admit: 2017-12-21 | Discharge: 2017-12-21 | Disposition: A | Discharge status: Home / Self Care | Payer: BLUE CROSS/BLUE SHIELD | Attending: Emergency Medicine | Admitting: Emergency Medicine

## 2017-12-21 ENCOUNTER — Other Ambulatory Visit: Payer: Self-pay

## 2017-12-21 ENCOUNTER — Emergency Department (HOSPITAL_COMMUNITY): Payer: BLUE CROSS/BLUE SHIELD

## 2017-12-21 ENCOUNTER — Emergency Department (EMERGENCY_DEPARTMENT_HOSPITAL): Payer: BLUE CROSS/BLUE SHIELD

## 2017-12-21 DIAGNOSIS — Z86718 Personal history of other venous thrombosis and embolism: Secondary | ICD-10-CM | POA: Insufficient documentation

## 2017-12-21 DIAGNOSIS — N6320 Unspecified lump in the left breast, unspecified quadrant: Secondary | ICD-10-CM | POA: Insufficient documentation

## 2017-12-21 DIAGNOSIS — I10 Essential (primary) hypertension: Secondary | ICD-10-CM | POA: Insufficient documentation

## 2017-12-21 DIAGNOSIS — F17200 Nicotine dependence, unspecified, uncomplicated: Secondary | ICD-10-CM | POA: Insufficient documentation

## 2017-12-21 DIAGNOSIS — R0789 Other chest pain: Secondary | ICD-10-CM | POA: Insufficient documentation

## 2017-12-21 DIAGNOSIS — R600 Localized edema: Secondary | ICD-10-CM | POA: Diagnosis not present

## 2017-12-21 DIAGNOSIS — R079 Chest pain, unspecified: Secondary | ICD-10-CM | POA: Diagnosis present

## 2017-12-21 LAB — BASIC METABOLIC PANEL
Anion gap: 8 (ref 5–15)
BUN: 16 mg/dL (ref 6–20)
CO2: 23 mmol/L (ref 22–32)
Calcium: 8.8 mg/dL — ABNORMAL LOW (ref 8.9–10.3)
Chloride: 107 mmol/L (ref 98–111)
Creatinine, Ser: 0.88 mg/dL (ref 0.44–1.00)
GFR calc Af Amer: >60 mL/min (ref >60)
GFR calc non Af Amer: >60 mL/min (ref >60)
Glucose, Bld: 101 mg/dL — ABNORMAL HIGH (ref 70–99)
Potassium: 3.4 mmol/L — ABNORMAL LOW (ref 3.5–5.1)
SODIUM: 138 mmol/L (ref 135–145)

## 2017-12-21 LAB — CBC
HCT: 39.2 % (ref 36.0–46.0)
Hemoglobin: 11.4 g/dL — ABNORMAL LOW (ref 12.0–15.0)
MCH: 23.9 pg — ABNORMAL LOW (ref 26.0–34.0)
MCHC: 29.1 g/dL — ABNORMAL LOW (ref 30.0–36.0)
MCV: 82.4 fL (ref 80.0–100.0)
Platelets: 315 10*3/uL (ref 150–400)
RBC: 4.76 MIL/uL (ref 3.87–5.11)
RDW: 13.9 % (ref 11.5–15.5)
WBC: 6.9 10*3/uL (ref 4.0–10.5)
nRBC: 0 % (ref 0.0–0.2)

## 2017-12-21 LAB — I-STAT BETA HCG BLOOD, ED (MC, WL, AP ONLY): I-stat hCG, quantitative: <5 m[IU]/mL (ref <5)

## 2017-12-21 LAB — I-STAT TROPONIN, ED: TROPONIN I, POC: 0 ng/mL (ref 0.00–0.08)

## 2017-12-21 MED ORDER — MORPHINE SULFATE (PF) 4 MG/ML IV SOLN
4.0000 mg | Freq: Once | INTRAVENOUS | Status: AC
  Administered 2017-12-21: 4 mg via INTRAVENOUS
  Filled 2017-12-21: qty 1

## 2017-12-21 MED ORDER — ONDANSETRON HCL 4 MG/2ML IJ SOLN
4.0000 mg | Freq: Once | INTRAMUSCULAR | Status: AC
  Administered 2017-12-21: 4 mg via INTRAVENOUS
  Filled 2017-12-21: qty 2

## 2017-12-21 MED ORDER — IOPAMIDOL (ISOVUE-370) INJECTION 76%
100.0000 mL | Freq: Once | INTRAVENOUS | Status: AC | PRN
  Administered 2017-12-21: 75 mL via INTRAVENOUS

## 2017-12-21 MED ORDER — IOPAMIDOL (ISOVUE-370) INJECTION 76%
INTRAVENOUS | Status: AC
  Filled 2017-12-21: qty 100

## 2017-12-21 NOTE — Discharge Instructions (Addendum)
Follow up with your Physician for recheck.  Our radiology advised that you need to have a mammogram to evaluate breast tissue.

## 2017-12-21 NOTE — ED Notes (Signed)
Patient transported to X-ray 

## 2017-12-21 NOTE — ED Notes (Signed)
Patient transported to CT 

## 2017-12-21 NOTE — ED Triage Notes (Signed)
Pt arrives to ED from work with complaints of  left sided chest pain starting at 1140 that radiates to her breast. EMS reports pt woke up with chest pain thgis morning but it resolved. Pt took 324 ASA and x2 nitro via EMS. Pt placed in position of comfort with bed locked and lowered, call bell in reach.

## 2017-12-21 NOTE — ED Provider Notes (Addendum)
[facility] Provider Note   CSN: [id number] Arrival date & time: 12/21/17  1224     History   Chief Complaint Chief Complaint  Patient presents with  . Chest Pain    HPI Betty Harris is a 32 y.o. female.  The history is provided by the patient. No language interpreter was used.  Chest Pain   This is a new problem. The current episode started 1 to 2 hours ago. The problem occurs constantly. The problem has not changed since onset.The pain is present in the lateral region. The pain is moderate. The pain does not radiate. Pertinent negatives include no abdominal pain and no shortness of breath. The treatment provided moderate relief.  Her past medical history is significant for DVT and PE.   Pt has a history of blood clots  Pt has an IVC filter.   Past Medical History:  Diagnosis Date  . History of blood clots   . Hypertension   . PTSD (post-traumatic stress disorder)     Patient Active Problem List   Diagnosis Date Noted  . History of hysterectomy 02/21/2017  . Breast mass in female 02/22/2013  . Pulmonary embolism (HCC) 02/19/2013  . Acute thromboembolism of deep veins of lower extremity (HCC) 10/30/2012    Past Surgical History:  Procedure Laterality Date  . ABDOMINAL HYSTERECTOMY    . VENA CAVA FILTER PLACEMENT       OB History    Gravida  2   Para      Term      Preterm      AB  1   Living        SAB  1   TAB      Ectopic      Multiple      Live Births  1            Home Medications    Prior to Admission medications   Medication Sig Start Date End Date Taking? Authorizing Provider  methocarbamol (ROBAXIN) 500 MG tablet Take 500 mg by mouth 3 (three) times daily as needed for muscle spasms.  09/26/17   [provider]    Family History Family History  Problem Relation Age of Onset  . Hypertension Mother   . Hypertension Father     Social History Social History   Tobacco Use   . Smoking status: Current Every Day Smoker    Last attempt to quit: 01/12/2017    Years since quitting: 0.9  . Smokeless tobacco: Never Used  Substance Use Topics  . Alcohol use: Yes    Comment: OCC  . Drug use: No     Allergies   Patient has no known allergies.   Review of Systems Review of Systems  Respiratory: Negative for shortness of breath.   Cardiovascular: Positive for chest pain.  Gastrointestinal: Negative for abdominal pain.  All other systems reviewed and are negative.    Physical Exam Updated Vital Signs BP 100/61 (BP Location: Left Arm)   Pulse 72   Temp 98.3 F (36.8 C) (Oral)   Resp 16   Ht 5\' 9"  (1.753 m)   Wt 108.9 kg   LMP 08/06/2010   SpO2 100%   BMI 35.44 kg/m   Physical Exam  Constitutional: She is oriented to person, place, and time. She appears well-developed and well-nourished.  HENT:  Head: Normocephalic.  Eyes: EOM are normal.  Neck: Normal range of motion.  Cardiovascular: Normal rate, regular  rhythm and normal pulses.  Tender left chest wall to palpation,   Pulmonary/Chest: Effort normal and breath sounds normal.  Abdominal: Soft. Bowel sounds are normal. She exhibits no distension.  Musculoskeletal: Normal range of motion.       Right lower leg: Normal.       Left lower leg: Normal.  Neurological: She is alert and oriented to person, place, and time.  Skin: Skin is warm.  Psychiatric: She has a normal mood and affect.  Nursing note and vitals reviewed.    ED Treatments / Results  Labs (all labs ordered are listed, but only abnormal results are displayed) Labs Reviewed  BASIC METABOLIC PANEL - Abnormal; Notable for the following components:      Result Value   Potassium 3.4 (*)    Glucose, Bld 101 (*)    Calcium 8.8 (*)    All other components within normal limits  CBC - Abnormal; Notable for the following components:   Hemoglobin 11.4 (*)    MCH 23.9 (*)    MCHC 29.1 (*)    All other components within normal  limits  I-STAT TROPONIN, ED  I-STAT BETA HCG BLOOD, ED ([facility], [facility], [facility] ONLY)    EKG None  Radiology Dg Chest 2 View  Result Date: 12/21/2017 CLINICAL DATA:  Sharp pain in right breast EXAM: CHEST - 2 VIEW COMPARISON:  01/13/2017 FINDINGS: Normal heart size. Lungs clear. No pneumothorax. No pleural effusion. IMPRESSION: No active cardiopulmonary disease. Electronically Signed   By: Jolaine Click M.D.   On: 12/21/2017 13:10    Procedures Procedures (including critical care time)  Medications Ordered in ED Medications  morphine 4 MG/ML injection 4 mg (4 mg Intravenous Given 12/21/17 1327)  ondansetron (ZOFRAN) injection 4 mg (4 mg Intravenous Given 12/21/17 1327)     Initial Impression / Assessment and Plan / ED Course  I have reviewed the triage vital signs and the nursing notes.  Pertinent labs & imaging results that were available during my care of the patient were reviewed by me and considered in my medical decision making (see chart for details).     MDM   Labs obtained and reviewed. Ct angio no pe,  Doppler ultrasound upper extremities normal.  Pt has a filter in vena cava.   Ct angio shows breast mass.  Pt advised radiologist has advised that pt needs a mammogram.  Pt advised and told to see her primary MD to have scheduled    Final Clinical Impressions(s) / ED Diagnoses   Final diagnoses:  Chest wall pain  Breast mass, left    ED Discharge Orders    None    An After Visit Summary was printed and given to the patient.    Elson Areas, PA-C 12/21/17 1920    Charlynne Pander, MD 12/22/17 2008    Elson Areas, PA-C 01/17/18 1526    Charlynne Pander, MD 01/17/18 [phone]

## 2017-12-21 NOTE — ED Notes (Signed)
Pt transported to vascular.  °

## 2017-12-21 NOTE — Progress Notes (Signed)
BUE venous duplex       has been completed. Preliminary results can be found under CV proc through chart review. Farrel DemarkJill Eunice, RDMS, RVT

## 2017-12-23 ENCOUNTER — Telehealth: Payer: Self-pay | Admitting: Hematology and Oncology

## 2017-12-23 NOTE — Telephone Encounter (Signed)
Returned patient's call re being in ED and needing appointment with RR. Gave patient f/u for 12/11 @ 3:30 pm - rescheduled from nov. Patient not able to come 12/9.

## 2017-12-27 ENCOUNTER — Encounter (HOSPITAL_COMMUNITY): Payer: Self-pay | Admitting: Emergency Medicine

## 2017-12-27 ENCOUNTER — Emergency Department (HOSPITAL_COMMUNITY): Payer: BLUE CROSS/BLUE SHIELD

## 2017-12-27 ENCOUNTER — Emergency Department (HOSPITAL_COMMUNITY)
Admission: EM | Admit: 2017-12-27 | Discharge: 2017-12-27 | Disposition: A | Discharge status: Home / Self Care | Payer: BLUE CROSS/BLUE SHIELD | Attending: Emergency Medicine | Admitting: Emergency Medicine

## 2017-12-27 ENCOUNTER — Other Ambulatory Visit: Payer: Self-pay

## 2017-12-27 DIAGNOSIS — Z86718 Personal history of other venous thrombosis and embolism: Secondary | ICD-10-CM | POA: Diagnosis not present

## 2017-12-27 DIAGNOSIS — R0789 Other chest pain: Secondary | ICD-10-CM | POA: Diagnosis not present

## 2017-12-27 DIAGNOSIS — F17200 Nicotine dependence, unspecified, uncomplicated: Secondary | ICD-10-CM | POA: Diagnosis not present

## 2017-12-27 DIAGNOSIS — R079 Chest pain, unspecified: Secondary | ICD-10-CM | POA: Diagnosis present

## 2017-12-27 DIAGNOSIS — I10 Essential (primary) hypertension: Secondary | ICD-10-CM | POA: Insufficient documentation

## 2017-12-27 LAB — BASIC METABOLIC PANEL
Anion gap: 14 (ref 5–15)
BUN: 13 mg/dL (ref 6–20)
CO2: 18 mmol/L — ABNORMAL LOW (ref 22–32)
Calcium: 9.7 mg/dL (ref 8.9–10.3)
Chloride: 105 mmol/L (ref 98–111)
Creatinine, Ser: 0.86 mg/dL (ref 0.44–1.00)
GFR calc Af Amer: >60 mL/min (ref >60)
GFR calc non Af Amer: >60 mL/min (ref >60)
GLUCOSE: 101 mg/dL — AB (ref 70–99)
Potassium: 3.5 mmol/L (ref 3.5–5.1)
Sodium: 137 mmol/L (ref 135–145)

## 2017-12-27 LAB — CBC
HCT: 41.7 % (ref 36.0–46.0)
Hemoglobin: 12.7 g/dL (ref 12.0–15.0)
MCH: 24.3 pg — AB (ref 26.0–34.0)
MCHC: 30.5 g/dL (ref 30.0–36.0)
MCV: 79.7 fL — AB (ref 80.0–100.0)
Platelets: 376 10*3/uL (ref 150–400)
RBC: 5.23 MIL/uL — ABNORMAL HIGH (ref 3.87–5.11)
RDW: 13.7 % (ref 11.5–15.5)
WBC: 13.4 10*3/uL — ABNORMAL HIGH (ref 4.0–10.5)
nRBC: 0 % (ref 0.0–0.2)

## 2017-12-27 LAB — I-STAT TROPONIN, ED: Troponin i, poc: 0 ng/mL (ref 0.00–0.08)

## 2017-12-27 LAB — I-STAT BETA HCG BLOOD, ED (MC, WL, AP ONLY): I-stat hCG, quantitative: <5 m[IU]/mL (ref <5)

## 2017-12-27 NOTE — ED Triage Notes (Signed)
Pt presents to ED via [facility] with complaints of CP. Pt stated last week she had a "mild MI" but had not cath or stent placement. EMS gave 2 nitro, 324 ASA, 4mg  of zofran.  127/95 HR 80 100% 2L CBG 137

## 2017-12-27 NOTE — ED Provider Notes (Signed)
[facility] Provider Note   CSN: [id number] Arrival date & time: 12/27/17  1612     History   Chief Complaint No chief complaint on file.   HPI Betty Harris is a 32 y.o. female.  HPI   She presents by EMS for evaluation of chest pain, left upper, associated with dizziness, and shortness of breath.  The pain is been coming and going since last week when she was evaluated here for the same problem.  She is not using anything for the discomfort.  She denies fever, chills, focal weakness or paresthesia.  She sometimes gets nauseated when she has the pain.  She has a follow-up appointment scheduled with her PCP, in  2 days.  There are no other no modifying factors.  Past Medical History:  Diagnosis Date  . History of blood clots   . Hypertension   . PTSD (post-traumatic stress disorder)     Patient Active Problem List   Diagnosis Date Noted  . History of hysterectomy 02/21/2017  . Breast mass in female 02/22/2013  . Pulmonary embolism (HCC) 02/19/2013  . Acute thromboembolism of deep veins of lower extremity (HCC) 10/30/2012    Past Surgical History:  Procedure Laterality Date  . ABDOMINAL HYSTERECTOMY    . VENA CAVA FILTER PLACEMENT       OB History    Gravida  2   Para      Term      Preterm      AB  1   Living        SAB  1   TAB      Ectopic      Multiple      Live Births  1            Home Medications    Prior to Admission medications   Medication Sig Start Date End Date Taking? Authorizing Provider  methocarbamol (ROBAXIN) 500 MG tablet Take 500 mg by mouth 3 (three) times daily as needed for muscle spasms.  09/26/17   [provider]    Family History Family History  Problem Relation Age of Onset  . Hypertension Mother   . Hypertension Father     Social History Social History   Tobacco Use  . Smoking status: Current Every Day Smoker    Last attempt to quit: 01/12/2017   Years since quitting: 0.9  . Smokeless tobacco: Never Used  Substance Use Topics  . Alcohol use: Yes    Comment: OCC  . Drug use: No     Allergies   Patient has no known allergies.   Review of Systems Review of Systems  All other systems reviewed and are negative.    Physical Exam Updated Vital Signs BP 102/72 (BP Location: Left Arm)   Pulse 67   Temp 98.7 F (37.1 C)   Resp 16   Ht 5\' 9"  (1.753 m)   Wt 108.9 kg   LMP 08/06/2010   SpO2 100%   BMI 35.44 kg/m   Physical Exam  Constitutional: She is oriented to person, place, and time. She appears well-developed. No distress.  Overweight  HENT:  Head: Normocephalic and atraumatic.  Eyes: Pupils are equal, round, and reactive to light. Conjunctivae and EOM are normal.  Neck: Normal range of motion and phonation normal. Neck supple.  Cardiovascular: Normal rate and regular rhythm.  Pulmonary/Chest: Effort normal and breath sounds normal. She exhibits tenderness (Left upper anterior chest wall tenderness, moderate.  No associated crepitation or deformity.).  Abdominal: Soft. She exhibits no distension. There is no tenderness. There is no guarding.  Musculoskeletal: Normal range of motion. She exhibits no deformity.  Neurological: She is alert and oriented to person, place, and time. No cranial nerve deficit. She exhibits normal muscle tone.  Skin: Skin is warm and dry.  Psychiatric: She has a normal mood and affect. Her behavior is normal. Judgment and thought content normal.  Nursing note and vitals reviewed.    ED Treatments / Results  Labs (all labs ordered are listed, but only abnormal results are displayed) Labs Reviewed  BASIC METABOLIC PANEL - Abnormal; Notable for the following components:      Result Value   CO2 18 (*)    Glucose, Bld 101 (*)    All other components within normal limits  CBC - Abnormal; Notable for the following components:   WBC 13.4 (*)    RBC 5.23 (*)    MCV 79.7 (*)    MCH 24.3  (*)    All other components within normal limits  I-STAT TROPONIN, ED  I-STAT BETA HCG BLOOD, ED ([facility], [facility], [facility] ONLY)    EKG EKG Interpretation  Date/Time:  Tuesday December 27 2017 16:23:04 EST Ventricular Rate:  73 PR Interval:  120 QRS Duration: 84 QT Interval:  386 QTC Calculation: 425 R Axis:   74 Text Interpretation:  Normal sinus rhythm Normal ECG Since last tracing rate slower Confirmed by Mancel Bale [phone]) on 12/27/2017 5:46:42 PM   Radiology Dg Chest 2 View  Result Date: 12/27/2017 CLINICAL DATA:  Chest pain EXAM: CHEST - 2 VIEW COMPARISON:  Chest radiograph and chest CT December 21, 2017. FINDINGS: The lungs are clear. The heart size and pulmonary vascularity are normal. No adenopathy. No pneumothorax. No bone lesions. IMPRESSION: No edema or consolidation. Electronically Signed   By: Bretta Bang III M.D.   On: 12/27/2017 17:40    Procedures Procedures (including critical care time)  Medications Ordered in ED Medications - No data to display   Initial Impression / Assessment and Plan / ED Course  I have reviewed the triage vital signs and the nursing notes.  Pertinent labs & imaging results that were available during my care of the patient were reviewed by me and considered in my medical decision making (see chart for details).  Clinical Course as of Dec 27 1837  Tue Dec 27, 2017  1826 Normal  I-Stat beta hCG blood, ED [EW]  1826 Normal except white count high  CBC(!) [EW]  1826 Normal  I-stat troponin, ED [EW]  1826 Normal except CO2 low glucose high.  Basic metabolic panel(!) [EW]    Clinical Course User Index [EW] Mancel Bale, MD    Patient Vitals for the past 24 hrs:  BP Temp Temp src Pulse Resp SpO2 Height Weight  12/27/17 1824 102/72 98.7 F (37.1 C) - 67 16 100 % - -  12/27/17 1619 - 98.1 F (36.7 C) Oral - - - 5\' 9"  (1.753 m) 108.9 kg    6:25 PM Reevaluation with update and discussion. After initial assessment and treatment, an  updated evaluation reveals no change in clinical status, findings discussed with the patient and all questions were answered. Mancel Bale   Medical Decision Making: Nonspecific chest pain, patient recently comprehensively evaluated, and has normal evaluation today.  Pain is noncardiac.  Occasion for hospitalization or further ED intervention at this time.  CRITICAL CARE-no Performed by: Mancel Bale  Nursing Notes  Reviewed/ Care Coordinated Applicable Imaging Reviewed Interpretation of Laboratory Data incorporated into ED treatment  The patient appears reasonably screened and/or stabilized for discharge and I doubt any other medical condition or other [facility] requiring further screening, evaluation, or treatment in the ED at this time prior to discharge.  Plan: Home Medications-OTC analgesia of choice; Home Treatments-heat to affected area; return here if the recommended treatment, does not improve the symptoms; Recommended follow up-PCP follow-up in 2 days as scheduled    Final Clinical Impressions(s) / ED Diagnoses   Final diagnoses:  Chest wall pain    ED Discharge Orders    None       Mancel Bale, MD 12/27/17 1839

## 2017-12-27 NOTE — Discharge Instructions (Signed)
There is no sign of heart or lung problems at this time.  For the discomfort, take Tylenol 650 mg every 4 hours.  Also try using heat on the sore area 3-4 times a day.  See your doctor in 2 days, as scheduled.

## 2017-12-28 ENCOUNTER — Ambulatory Visit: Payer: Self-pay | Admitting: Hematology and Oncology

## 2018-01-02 ENCOUNTER — Encounter (HOSPITAL_COMMUNITY): Payer: Self-pay | Admitting: Licensed Clinical Social Worker

## 2018-01-03 ENCOUNTER — Ambulatory Visit: Payer: Self-pay | Admitting: Hematology

## 2018-01-05 ENCOUNTER — Other Ambulatory Visit: Payer: Self-pay | Admitting: Surgery

## 2018-01-13 ENCOUNTER — Ambulatory Visit (HOSPITAL_COMMUNITY): Payer: Self-pay | Admitting: Psychiatry

## 2018-01-19 NOTE — Progress Notes (Signed)
[facility] OFFICE PROGRESS NOTE  Patient Care Team: Associates, [facility] as PCP - General (Family Medicine)  HEME/ONC OVERVIEW: 1. [facility]  -2007: hx of DVT w/ PTE during pregnancy; IVC filter placed; treated with Lovenox during pregnancy and warfarin after unsuccessful delivery x 1 year  -10/2012: non-occlusive DVT within one of the posterior tibial veins; treated with Lovenox bridged to warfarin, but had frequent subtherapeutic INR -Sporadic duration of anticoagulation due to insurance issues; most recently received Xarelto from sometime in 2017 until 10/2017  -Hypercoaguable work-up, including prothrombin gene mutation, Factor V Leiden and APLS, was negative   2. 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  -I reviewed the patient's records in detail, [facility] and external imaging results dating back to 2014 -In summary, patient was diagnosed with LLE DVT and PTE in 2007 during her second trimester of pregnancy, for which she was treated with 1 year of anticoagulation; she developed recurrent, unprovoked DVT within the left posterior tibial vein, for which she was treated with Lovenox and subsequently bridged to warfarin, but the INR was frequently subtherapeutic; she took warfarin for a few months, but stopped medication due to insurance issues; Xarelto was resumed in 2017 and she took the medication until 10/2017 after her first visit with Dr. Caron Presumeuben -Hypercoagulable work-up, including prothrombin gene mutation, factor V Leiden, and antiphospholipid syndrome, with all negative -I discussed with the patient regarding the rationale for resuming anticoagulation, given the unprovoked  recurrent VTE in 2014, and her increased risk for recurrent VTE's -As she has received at least 2 years of therapeutic anticoagulation, and her most recent DVT was in 2014, she is a candidate for low-dose Xarelto for secondary prophylaxis -After lengthy discussions of benefits and risks, the patient agreed to resume Xarelto 10mg  daily for secondary VTE prophylaxis; the prescription was sent to the patient's pharmacy  -I reinforced the importance of preventive strategies such as avoiding hormonal supplement, avoiding cigarette smoking, keeping up-to-date with screening programs for early cancer detection, frequent ambulation for long distance travel and aggressive DVT prophylaxis in all surgical settings. -Should she need any interruption of the anticoagulation for elective procedures in the future, feel free to contact me regarding peri-operative management.  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 Ezzard StandingNewman in the end of 2019, but the procedure has been canceled due to the out-of-pocket cost of the procedure -I encouraged patient to follow-up with her PCP for breast mass surveillance to monitor for any interval change  Orders Placed This Encounter  Procedures  . CBC with Differential (Cancer Center Only)    Standing Status:   Future    Standing Expiration Date:   02/28/2019  . CMP (Cancer Center only)    Standing Status:   Future    Standing Expiration Date:   02/28/2019    All questions were answered. The patient knows to call the clinic with any problems, questions or concerns. No barriers to learning was detected.  A total of more than 25 minutes were spent face-to-face with the patient during this encounter and over half of that  time was spent on counseling and coordination of care as outlined above.   Return in 6 to 8 weeks for  anticoagulation monitoring.   Betty Holms, MD 01/24/2018 11:33 AM  CHIEF COMPLAINT: "I am here for my labs"  INTERVAL HISTORY: [name] returns to clinic for follow-up of her recurrent left lower extremity DVT.  Patient reports that she has been doing well, and denies any recurrent lower extremity pain or swelling, chest pain or worsening dyspnea since stopping Xarelto in 10/2017.  She has had less appetite over the past 2 days, but denies any other GI related symptoms, such as abdominal pain, nausea, vomiting, or diarrhea.  She was scheduled for a lumpectomy of the left breast mass with Dr. Ezzard Standing in the end of 2019, but the procedure was canceled due to the out-of-pocket cost.  She denies any interval change in the left breast mass, such as skin change, pain, change in size, or nipple discharge.  REVIEW OF SYSTEMS:   Constitutional: ( - ) fevers, ( - )  chills , ( - ) night sweats Eyes: ( - ) blurriness of vision, ( - ) double vision, ( - ) watery eyes Ears, nose, mouth, throat, and face: ( - ) mucositis, ( - ) sore throat Respiratory: ( - ) cough, ( - ) dyspnea, ( - ) wheezes Cardiovascular: ( - ) palpitation, ( - ) chest discomfort, ( - ) lower extremity swelling Gastrointestinal:  ( - ) nausea, ( - ) heartburn, ( - ) change in bowel habits Skin: ( - ) abnormal skin rashes Lymphatics: ( - ) new lymphadenopathy, ( - ) easy bruising Neurological: ( - ) numbness, ( - ) tingling, ( - ) new weaknesses Behavioral/Psych: ( - ) mood change, ( - ) new changes  All other systems were reviewed with the patient and are negative.  I have reviewed the past medical history, past surgical history, social history and family history with the patient and they are unchanged from previous note.  ALLERGIES:  has No Known Allergies.  MEDICATIONS:  Current Outpatient Medications  Medication Sig Dispense Refill  . methocarbamol (ROBAXIN) 500 MG tablet Take 500 mg by mouth 3 (three) times daily as needed for  muscle spasms.     . rivaroxaban (XARELTO) 10 MG TABS tablet Take 1 tablet (10 mg total) by mouth daily. 90 tablet 3   No current facility-administered medications for this visit.     PHYSICAL EXAMINATION: ECOG PERFORMANCE STATUS: 0 - Asymptomatic  Today's Vitals   01/24/18 1056  BP: 126/76  Pulse: (!) 104  Resp: 20  Temp: 97.7 F (36.5 C)  TempSrc: Oral  SpO2: 99%  Weight: 242 lb 14.4 oz (110.2 kg)  Height: 5\' 9"  (1.753 m)  PainSc: 0-No pain   Body mass index is 35.87 kg/m.  Filed Weights   01/24/18 1056  Weight: 242 lb 14.4 oz (110.2 kg)    GENERAL: alert, no distress and comfortable SKIN: skin color, texture, turgor are normal, no rashes or significant lesions EYES: conjunctiva are pink and non-injected, sclera clear OROPHARYNX: no exudate, no erythema; lips, buccal mucosa, and tongue normal  NECK: supple, non-tender LUNGS: clear to auscultation and percussion with normal breathing effort HEART: regular rate & rhythm and no murmurs and no lower extremity edema ABDOMEN: soft, non-tender, non-distended, normal bowel sounds Musculoskeletal: no cyanosis of digits and no clubbing  PSYCH: alert & oriented x 3, fluent speech NEURO: no focal motor/sensory deficits  LABORATORY DATA:  I  have reviewed the data as listed    Component Value Date/Time   NA 137 12/27/2017 1623   K 3.5 12/27/2017 1623   CL 105 12/27/2017 1623   CO2 18 (L) 12/27/2017 1623   GLUCOSE 101 (H) 12/27/2017 1623   BUN 13 12/27/2017 1623   CREATININE 0.86 12/27/2017 1623   CALCIUM 9.7 12/27/2017 1623   PROT 7.3 09/25/2017 1118   ALBUMIN 3.9 09/25/2017 1118   AST 22 09/25/2017 1118   ALT 18 09/25/2017 1118   ALKPHOS 54 09/25/2017 1118   BILITOT 0.6 09/25/2017 1118   GFRNONAA >60 12/27/2017 1623   GFRAA >60 12/27/2017 1623    No results found for: SPEP, UPEP  Lab Results  Component Value Date   WBC 13.4 (H) 12/27/2017   NEUTROABS 3.0 09/25/2017   HGB 12.7 12/27/2017   HCT 41.7 12/27/2017    MCV 79.7 (L) 12/27/2017   PLT 376 12/27/2017      Chemistry      Component Value Date/Time   NA 137 12/27/2017 1623   K 3.5 12/27/2017 1623   CL 105 12/27/2017 1623   CO2 18 (L) 12/27/2017 1623   BUN 13 12/27/2017 1623   CREATININE 0.86 12/27/2017 1623      Component Value Date/Time   CALCIUM 9.7 12/27/2017 1623   ALKPHOS 54 09/25/2017 1118   AST 22 09/25/2017 1118   ALT 18 09/25/2017 1118   BILITOT 0.6 09/25/2017 1118       RADIOGRAPHIC STUDIES: I have personally reviewed the radiological images as listed below and agreed with the findings in the report. Dg Chest 2 View  Result Date: 12/27/2017 CLINICAL DATA:  Chest pain EXAM: CHEST - 2 VIEW COMPARISON:  Chest radiograph and chest CT December 21, 2017. FINDINGS: The lungs are clear. The heart size and pulmonary vascularity are normal. No adenopathy. No pneumothorax. No bone lesions. IMPRESSION: No edema or consolidation. Electronically Signed   By: Bretta BangWilliam  Woodruff III M.D.   On: 12/27/2017 17:40

## 2018-01-24 ENCOUNTER — Encounter: Payer: Self-pay | Admitting: Hematology

## 2018-01-24 ENCOUNTER — Telehealth: Payer: Self-pay | Admitting: Hematology

## 2018-01-24 ENCOUNTER — Inpatient Hospital Stay: Payer: BLUE CROSS/BLUE SHIELD | Attending: Hematology and Oncology | Admitting: Hematology

## 2018-01-24 VITALS — BP 126/76 | HR 104 | Temp 97.7°F | Resp 20 | Ht 69.0 in | Wt 242.9 lb

## 2018-01-24 DIAGNOSIS — N63 Unspecified lump in unspecified breast: Secondary | ICD-10-CM | POA: Diagnosis not present

## 2018-01-24 DIAGNOSIS — N6320 Unspecified lump in the left breast, unspecified quadrant: Secondary | ICD-10-CM | POA: Insufficient documentation

## 2018-01-24 DIAGNOSIS — Z86711 Personal history of pulmonary embolism: Secondary | ICD-10-CM | POA: Insufficient documentation

## 2018-01-24 DIAGNOSIS — Z7901 Long term (current) use of anticoagulants: Secondary | ICD-10-CM | POA: Diagnosis not present

## 2018-01-24 DIAGNOSIS — I82409 Acute embolism and thrombosis of unspecified deep veins of unspecified lower extremity: Secondary | ICD-10-CM | POA: Diagnosis not present

## 2018-01-24 DIAGNOSIS — Z86718 Personal history of other venous thrombosis and embolism: Secondary | ICD-10-CM | POA: Diagnosis not present

## 2018-01-24 DIAGNOSIS — I82402 Acute embolism and thrombosis of unspecified deep veins of left lower extremity: Secondary | ICD-10-CM | POA: Diagnosis not present

## 2018-01-24 DIAGNOSIS — I2699 Other pulmonary embolism without acute cor pulmonale: Secondary | ICD-10-CM

## 2018-01-24 MED ORDER — RIVAROXABAN 10 MG PO TABS: 10.0000 mg | ORAL_TABLET | Freq: Every day | ORAL | 3 refills | Status: DC

## 2018-01-24 NOTE — Patient Instructions (Signed)

## 2018-01-24 NOTE — Telephone Encounter (Signed)
Per 01/07 los: Return on 03/07/2018 with labs and clinic appt at [facility] patient the number to the [place] location.

## 2018-01-26 ENCOUNTER — Ambulatory Visit (HOSPITAL_COMMUNITY): Payer: Self-pay | Admitting: Psychiatry

## 2018-01-27 ENCOUNTER — Ambulatory Visit (INDEPENDENT_AMBULATORY_CARE_PROVIDER_SITE_OTHER): Payer: BLUE CROSS/BLUE SHIELD | Admitting: Psychiatry

## 2018-01-27 ENCOUNTER — Encounter (HOSPITAL_COMMUNITY): Payer: Self-pay | Admitting: Psychiatry

## 2018-01-27 VITALS — BP 110/68 | HR 93 | Ht 66.5 in | Wt 245.0 lb

## 2018-01-27 DIAGNOSIS — F331 Major depressive disorder, recurrent, moderate: Secondary | ICD-10-CM | POA: Diagnosis not present

## 2018-01-27 DIAGNOSIS — F4310 Post-traumatic stress disorder, unspecified: Secondary | ICD-10-CM | POA: Diagnosis not present

## 2018-01-27 MED ORDER — ESCITALOPRAM OXALATE 10 MG PO TABS: ORAL_TABLET | ORAL | 1 refills | Status: AC
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
## 2018-01-30 ENCOUNTER — Ambulatory Visit (HOSPITAL_COMMUNITY): Payer: BLUE CROSS/BLUE SHIELD | Admitting: Psychiatry

## 2018-01-30 ENCOUNTER — Ambulatory Visit (HOSPITAL_COMMUNITY): Payer: Self-pay | Admitting: Psychiatry

## 2018-02-18 ENCOUNTER — Ambulatory Visit (HOSPITAL_COMMUNITY): Payer: BLUE CROSS/BLUE SHIELD | Admitting: Psychiatry

## 2018-04-17 ENCOUNTER — Other Ambulatory Visit: Payer: Self-pay

## 2018-04-17 ENCOUNTER — Encounter (HOSPITAL_COMMUNITY): Payer: Self-pay | Admitting: Emergency Medicine

## 2018-04-17 ENCOUNTER — Emergency Department (HOSPITAL_COMMUNITY)
Admission: EM | Admit: 2018-04-17 | Discharge: 2018-04-17 | Disposition: A | Discharge status: Home / Self Care | Payer: BLUE CROSS/BLUE SHIELD | Attending: Emergency Medicine | Admitting: Emergency Medicine

## 2018-04-17 ENCOUNTER — Emergency Department (HOSPITAL_COMMUNITY): Payer: BLUE CROSS/BLUE SHIELD

## 2018-04-17 DIAGNOSIS — R197 Diarrhea, unspecified: Secondary | ICD-10-CM

## 2018-04-17 DIAGNOSIS — F17200 Nicotine dependence, unspecified, uncomplicated: Secondary | ICD-10-CM | POA: Diagnosis not present

## 2018-04-17 DIAGNOSIS — R112 Nausea with vomiting, unspecified: Secondary | ICD-10-CM | POA: Diagnosis present

## 2018-04-17 DIAGNOSIS — Z79899 Other long term (current) drug therapy: Secondary | ICD-10-CM | POA: Insufficient documentation

## 2018-04-17 DIAGNOSIS — I10 Essential (primary) hypertension: Secondary | ICD-10-CM | POA: Diagnosis not present

## 2018-04-17 DIAGNOSIS — Z86718 Personal history of other venous thrombosis and embolism: Secondary | ICD-10-CM | POA: Insufficient documentation

## 2018-04-17 LAB — COMPREHENSIVE METABOLIC PANEL
ALT: 17 U/L (ref 0–44)
AST: 20 U/L (ref 15–41)
Albumin: 3.6 g/dL (ref 3.5–5.0)
Alkaline Phosphatase: 49 U/L (ref 38–126)
Anion gap: 8 (ref 5–15)
BUN: 13 mg/dL (ref 6–20)
CO2: 21 mmol/L — ABNORMAL LOW (ref 22–32)
Calcium: 8.9 mg/dL (ref 8.9–10.3)
Chloride: 107 mmol/L (ref 98–111)
Creatinine, Ser: 0.81 mg/dL (ref 0.44–1.00)
GFR calc Af Amer: >60 mL/min (ref >60)
GFR calc non Af Amer: >60 mL/min (ref >60)
Glucose, Bld: 97 mg/dL (ref 70–99)
Potassium: 3.9 mmol/L (ref 3.5–5.1)
Sodium: 136 mmol/L (ref 135–145)
Total Bilirubin: 0.9 mg/dL (ref 0.3–1.2)
Total Protein: 6.7 g/dL (ref 6.5–8.1)

## 2018-04-17 LAB — CBC
HCT: 41.5 % (ref 36.0–46.0)
Hemoglobin: 12.7 g/dL (ref 12.0–15.0)
MCH: 24.4 pg — ABNORMAL LOW (ref 26.0–34.0)
MCHC: 30.6 g/dL (ref 30.0–36.0)
MCV: 79.8 fL — ABNORMAL LOW (ref 80.0–100.0)
Platelets: 311 10*3/uL (ref 150–400)
RBC: 5.2 MIL/uL — ABNORMAL HIGH (ref 3.87–5.11)
RDW: 13.7 % (ref 11.5–15.5)
WBC: 7.3 10*3/uL (ref 4.0–10.5)
nRBC: 0 % (ref 0.0–0.2)

## 2018-04-17 LAB — URINALYSIS, ROUTINE W REFLEX MICROSCOPIC
Bilirubin Urine: NEGATIVE
Glucose, UA: NEGATIVE mg/dL
Ketones, ur: NEGATIVE mg/dL
Nitrite: NEGATIVE
Protein, ur: NEGATIVE mg/dL
Specific Gravity, Urine: 1.027 (ref 1.005–1.030)
pH: 5 (ref 5.0–8.0)

## 2018-04-17 LAB — LIPASE, BLOOD: Lipase: 22 U/L (ref 11–51)

## 2018-04-17 MED ORDER — SODIUM CHLORIDE 0.9% FLUSH: 3.0000 mL | Freq: Once | INTRAVENOUS | Status: DC

## 2018-04-17 MED ORDER — SODIUM CHLORIDE 0.9 % IV BOLUS
1000.0000 mL | Freq: Once | INTRAVENOUS | Status: AC
  Administered 2018-04-17: 1000 mL via INTRAVENOUS

## 2018-04-17 MED ORDER — ONDANSETRON HCL 4 MG PO TABS: 4.0000 mg | ORAL_TABLET | Freq: Four times a day (QID) | ORAL | 0 refills | Status: AC

## 2018-04-17 MED ORDER — LOPERAMIDE HCL 2 MG PO CAPS
4.0000 mg | ORAL_CAPSULE | Freq: Once | ORAL | Status: AC
  Administered 2018-04-17: 4 mg via ORAL
  Filled 2018-04-17: qty 2

## 2018-04-17 MED ORDER — ONDANSETRON HCL 4 MG/2ML IJ SOLN
4.0000 mg | Freq: Once | INTRAMUSCULAR | Status: AC
  Administered 2018-04-17: 4 mg via INTRAVENOUS
  Filled 2018-04-17: qty 2

## 2018-04-17 NOTE — ED Triage Notes (Signed)
States n/v/d since yesterday also sates she has a cough

## 2018-04-20 NOTE — ED Provider Notes (Signed)
[facility] EMERGENCY DEPARTMENT Provider Note   CSN: [id number] Arrival date & time: 04/17/18  0906    History   Chief Complaint Chief Complaint  Patient presents with  . Vomiting  . Diarrhea  . Cough    HPI Betty Harris is a 33 y.o. female.     HPI   33 year old female with nausea/vomiting/diarrhea.  Onset yesterday.  Persistent since then.  One episode of vomiting.  Multiple loose stools.  No blood in her stool or emesis.  Crampy abdominal pain.  She also reports a nonproductive cough.  No fevers.  No urinary complaints.  Denies any sick contacts, significant travel history or recent antibiotic usage.  Past Medical History:  Diagnosis Date  . History of blood clots   . Hypertension   . PTSD (post-traumatic stress disorder)     Patient Active Problem List   Diagnosis Date Noted  . History of hysterectomy 02/21/2017  . Breast mass in female 02/22/2013  . Pulmonary embolism (HCC) 02/19/2013  . Acute thromboembolism of deep veins of lower extremity (HCC) 10/30/2012    Past Surgical History:  Procedure Laterality Date  . ABDOMINAL HYSTERECTOMY    . VENA CAVA FILTER PLACEMENT       OB History    Gravida  2   Para      Term      Preterm      AB  1   Living        SAB  1   TAB      Ectopic      Multiple      Live Births  1            Home Medications    Prior to Admission medications   Medication Sig Start Date End Date Taking? Authorizing Provider  escitalopram (LEXAPRO) 10 MG tablet Take 1/2 tab daily for 1 week and than full tab daily 01/27/18   Arfeen, Phillips Grout, MD  methocarbamol (ROBAXIN) 500 MG tablet Take 500 mg by mouth 3 (three) times daily as needed for muscle spasms.  09/26/17   [provider]  ondansetron (ZOFRAN) 4 MG tablet Take 1 tablet (4 mg total) by mouth every 6 (six) hours. 04/17/18   Raeford Razor, MD  rivaroxaban (XARELTO) 10 MG TABS tablet Take 1 tablet (10 mg total) by mouth daily.  01/24/18 04/24/18  Arthur Holms, MD    Family History Family History  Problem Relation Age of Onset  . Hypertension Mother   . Hypertension Father     Social History Social History   Tobacco Use  . Smoking status: Current Every Day Smoker    Last attempt to quit: 01/12/2017    Years since quitting: 1.2  . Smokeless tobacco: Never Used  Substance Use Topics  . Alcohol use: Yes    Alcohol/week: 1.0 - 2.0 standard drinks    Types: 1 - 2 Glasses of wine per week    Comment: OCC  . Drug use: No     Allergies   Patient has no known allergies.   Review of Systems Review of Systems  All systems reviewed and negative, other than as noted in HPI.  Physical Exam Updated Vital Signs BP 133/73   Pulse 79   Temp 98.6 F (37 C)   Resp 19   Ht 5\' 9"  (1.753 m)   Wt 109.8 kg   LMP 08/06/2010   SpO2 100%   BMI 35.74 kg/m   Physical Exam  Vitals signs and nursing note reviewed.  Constitutional:      General: She is not in acute distress.    Appearance: She is well-developed.  HENT:     Head: Normocephalic and atraumatic.  Eyes:     General:        Right eye: No discharge.        Left eye: No discharge.     Conjunctiva/sclera: Conjunctivae normal.  Neck:     Musculoskeletal: Neck supple.  Cardiovascular:     Rate and Rhythm: Normal rate and regular rhythm.     Heart sounds: Normal heart sounds. No murmur. No friction rub. No gallop.   Pulmonary:     Effort: Pulmonary effort is normal. No respiratory distress.     Breath sounds: Normal breath sounds.  Abdominal:     General: There is no distension.     Palpations: Abdomen is soft.     Tenderness: There is no abdominal tenderness.  Musculoskeletal:        General: No tenderness.  Skin:    General: Skin is warm and dry.  Neurological:     Mental Status: She is alert.  Psychiatric:        Behavior: Behavior normal.        Thought Content: Thought content normal.      ED Treatments / Results  Labs (all labs  ordered are listed, but only abnormal results are displayed) Labs Reviewed  COMPREHENSIVE METABOLIC PANEL - Abnormal; Notable for the following components:      Result Value   CO2 21 (*)    All other components within normal limits  CBC - Abnormal; Notable for the following components:   RBC 5.20 (*)    MCV 79.8 (*)    MCH 24.4 (*)    All other components within normal limits  URINALYSIS, ROUTINE W REFLEX MICROSCOPIC - Abnormal; Notable for the following components:   APPearance CLOUDY (*)    Hgb urine dipstick SMALL (*)    Leukocytes,Ua SMALL (*)    Bacteria, UA RARE (*)    All other components within normal limits  LIPASE, BLOOD    EKG EKG Interpretation  Date/Time:  Monday April 17 2018 09:22:40 EDT Ventricular Rate:  86 PR Interval:    QRS Duration: 79 QT Interval:  346 QTC Calculation: 414 R Axis:   57 Text Interpretation:  Sinus rhythm Borderline short PR interval Confirmed by Raeford Razor [phone]) on 04/17/2018 9:32:42 AM   Radiology No results found.  Procedures Procedures (including critical care time)  Medications Ordered in ED Medications  ondansetron (ZOFRAN) injection 4 mg (4 mg Intravenous Given 04/17/18 0950)  loperamide (IMODIUM) capsule 4 mg (4 mg Oral Given 04/17/18 0950)  sodium chloride 0.9 % bolus 1,000 mL (0 mLs Intravenous Stopped 04/17/18 1148)     Initial Impression / Assessment and Plan / ED Course  I have reviewed the triage vital signs and the nursing notes.  Pertinent labs & imaging results that were available during my care of the patient were reviewed by me and considered in my medical decision making (see chart for details).       33yF with n/v/d. Suspect viral GI illness. Abdominal exam benign. Feeling better after meds. Labs reassuring. Afebrile. HD stable. It has been determined that no acute conditions requiring further emergency intervention are present at this time. The patient has been advised of the diagnosis and plan. I  reviewed any labs and imaging including any potential incidental findings. I  have reviewed nursing notes and appropriate previous records. We have discussed signs and symptoms that warrant return to the ED and they are listed in the discharge instructions.      Final Clinical Impressions(s) / ED Diagnoses   Final diagnoses:  Nausea vomiting and diarrhea    ED Discharge Orders         Ordered    ondansetron (ZOFRAN) 4 MG tablet  Every 6 hours     04/17/18 1218           Raeford Razor, MD 04/20/18 1607

## 2019-01-12 ENCOUNTER — Other Ambulatory Visit: Payer: Self-pay

## 2019-01-12 ENCOUNTER — Emergency Department (HOSPITAL_COMMUNITY): Payer: BC Managed Care – PPO

## 2019-01-12 ENCOUNTER — Encounter (HOSPITAL_COMMUNITY): Payer: Self-pay | Admitting: Emergency Medicine

## 2019-01-12 ENCOUNTER — Emergency Department (HOSPITAL_COMMUNITY)
Admission: EM | Admit: 2019-01-12 | Discharge: 2019-01-13 | Disposition: A | Discharge status: Home / Self Care | Payer: BC Managed Care – PPO | Attending: Emergency Medicine | Admitting: Emergency Medicine

## 2019-01-12 DIAGNOSIS — Z5321 Procedure and treatment not carried out due to patient leaving prior to being seen by health care provider: Secondary | ICD-10-CM | POA: Diagnosis not present

## 2019-01-12 DIAGNOSIS — R079 Chest pain, unspecified: Secondary | ICD-10-CM | POA: Diagnosis present

## 2019-01-12 LAB — CBC
HCT: 42.6 % (ref 36.0–46.0)
Hemoglobin: 13.4 g/dL (ref 12.0–15.0)
MCH: 25.7 pg — ABNORMAL LOW (ref 26.0–34.0)
MCHC: 31.5 g/dL (ref 30.0–36.0)
MCV: 81.6 fL (ref 80.0–100.0)
Platelets: 312 10*3/uL (ref 150–400)
RBC: 5.22 MIL/uL — ABNORMAL HIGH (ref 3.87–5.11)
RDW: 14 % (ref 11.5–15.5)
WBC: 9.2 10*3/uL (ref 4.0–10.5)
nRBC: 0 % (ref 0.0–0.2)

## 2019-01-12 LAB — BASIC METABOLIC PANEL
Anion gap: 8 (ref 5–15)
BUN: 11 mg/dL (ref 6–20)
CO2: 24 mmol/L (ref 22–32)
Calcium: 9.4 mg/dL (ref 8.9–10.3)
Chloride: 107 mmol/L (ref 98–111)
Creatinine, Ser: 0.92 mg/dL (ref 0.44–1.00)
GFR calc Af Amer: >60 mL/min (ref >60)
GFR calc non Af Amer: >60 mL/min (ref >60)
Glucose, Bld: 100 mg/dL — ABNORMAL HIGH (ref 70–99)
Potassium: 4.1 mmol/L (ref 3.5–5.1)
Sodium: 139 mmol/L (ref 135–145)

## 2019-01-12 LAB — TROPONIN I (HIGH SENSITIVITY): Troponin I (High Sensitivity): <2 ng/L (ref <18)

## 2019-01-12 LAB — I-STAT BETA HCG BLOOD, ED (MC, WL, AP ONLY): I-stat hCG, quantitative: <5 m[IU]/mL (ref <5)

## 2019-01-12 MED ORDER — SODIUM CHLORIDE 0.9% FLUSH: 3.0000 mL | Freq: Once | INTRAVENOUS | Status: DC

## 2019-01-12 NOTE — ED Triage Notes (Signed)
Brought by ems for c/o central chest pain that started 2 hours ago.  Describes as sharp burning.  Given asa 324mg  pta.  Was covid positive 2 weeks ago.

## 2019-08-26 ENCOUNTER — Ambulatory Visit: Payer: Self-pay

## 2019-10-05 ENCOUNTER — Ambulatory Visit: Admit: 2019-10-05 | Discharge: 2019-10-05 | Discharge status: Absent without leave | Payer: Self-pay

## 2019-10-05 ENCOUNTER — Ambulatory Visit
Admission: EM | Admit: 2019-10-05 | Discharge: 2019-10-05 | Disposition: A | Discharge status: Home / Self Care | Payer: Self-pay | Attending: Physician Assistant | Admitting: Physician Assistant

## 2019-10-05 DIAGNOSIS — Z0189 Encounter for other specified special examinations: Secondary | ICD-10-CM

## 2019-10-05 NOTE — Discharge Instructions (Signed)

## 2019-10-05 NOTE — ED Triage Notes (Signed)
Patient states that she is here for covid testing due to an exposure from her niece.

## 2019-10-08 LAB — NOVEL CORONAVIRUS, NAA: SARS-CoV-2, NAA: NOT DETECTED

## 2020-03-27 IMAGING — CR DG CHEST 2V
2 series · 2 of 2 positions shown · non-contrast
Comparison: None.

CLINICAL DATA: Central chest pain beginning 2 hours ago. COVID
positive 2 weeks ago.

EXAM:
CHEST - 2 VIEW

[chest pa]
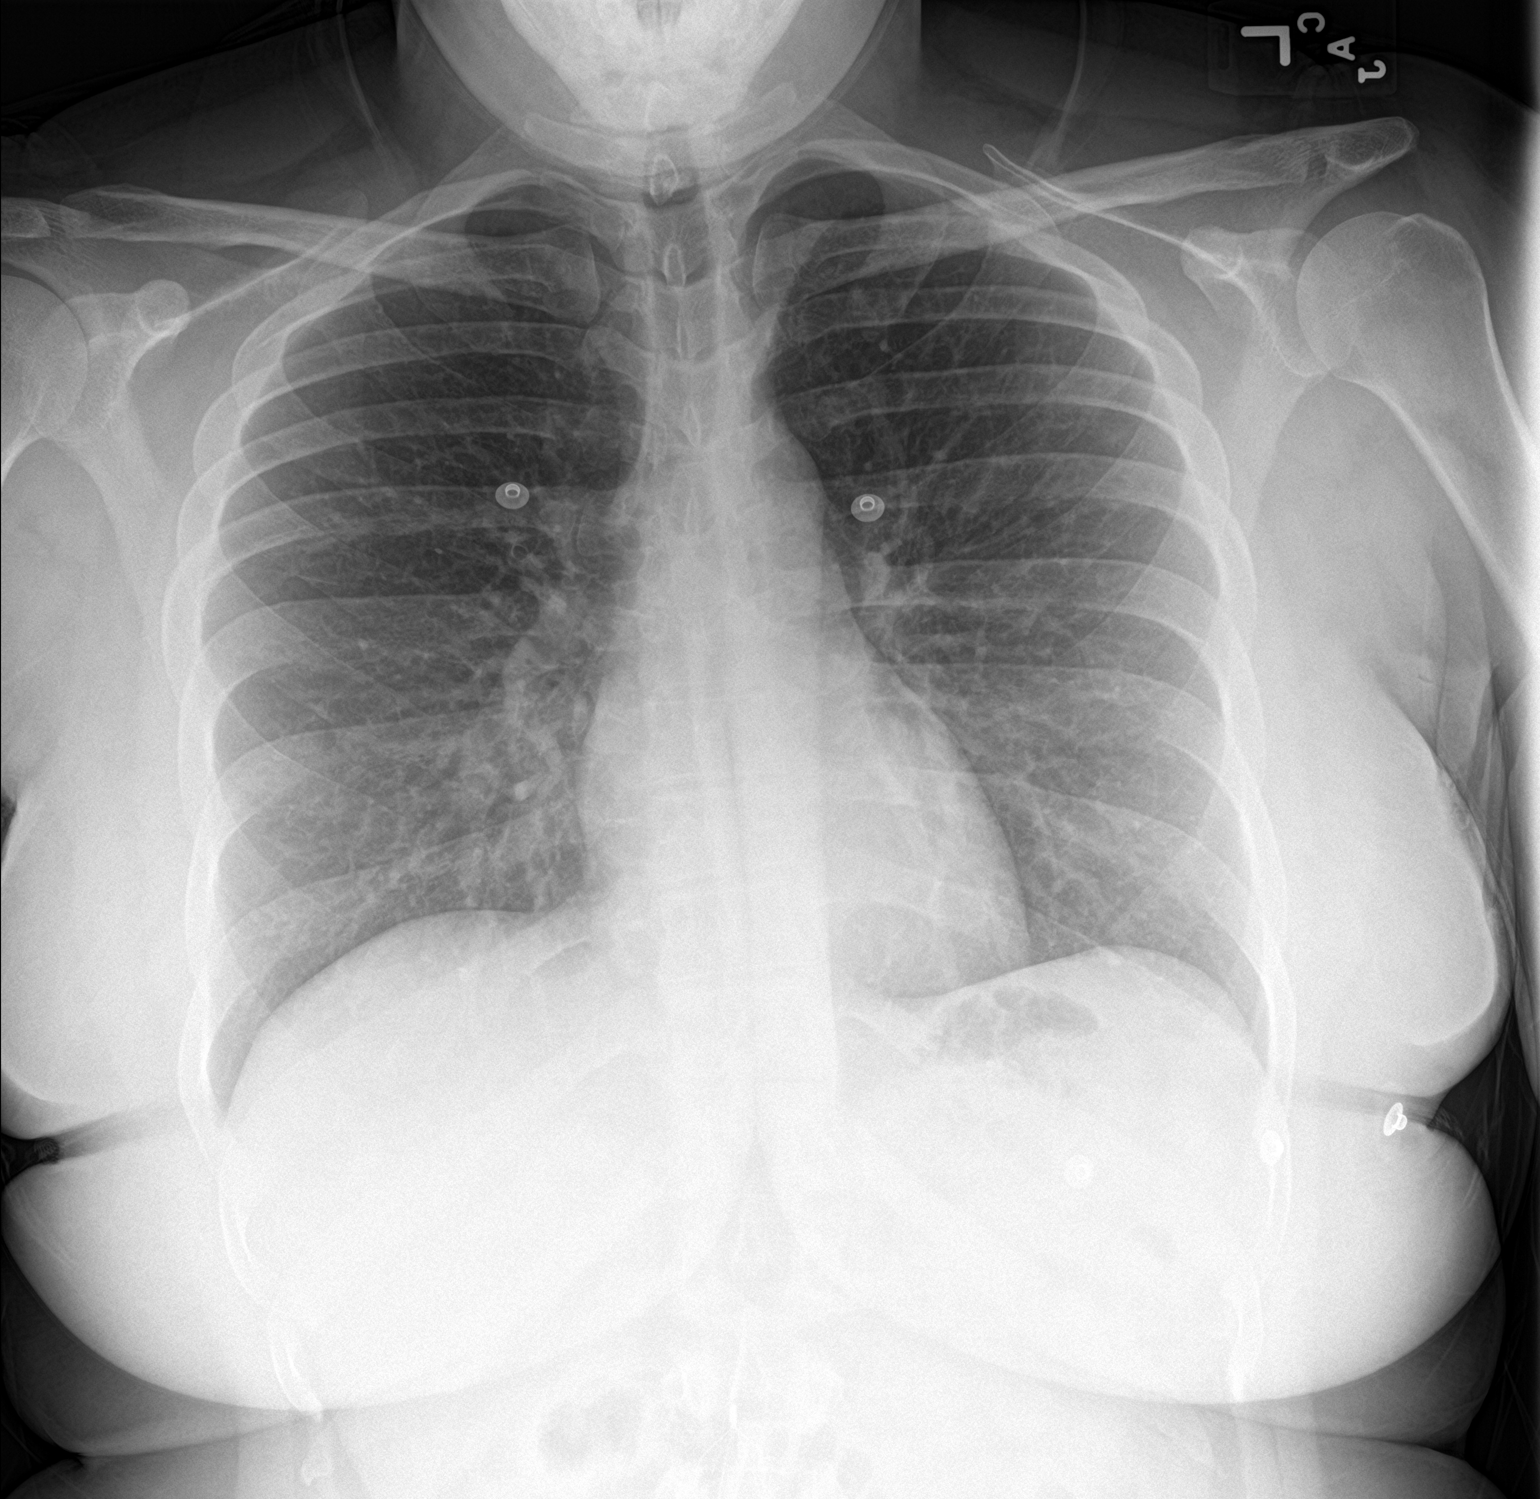

[chest lat]
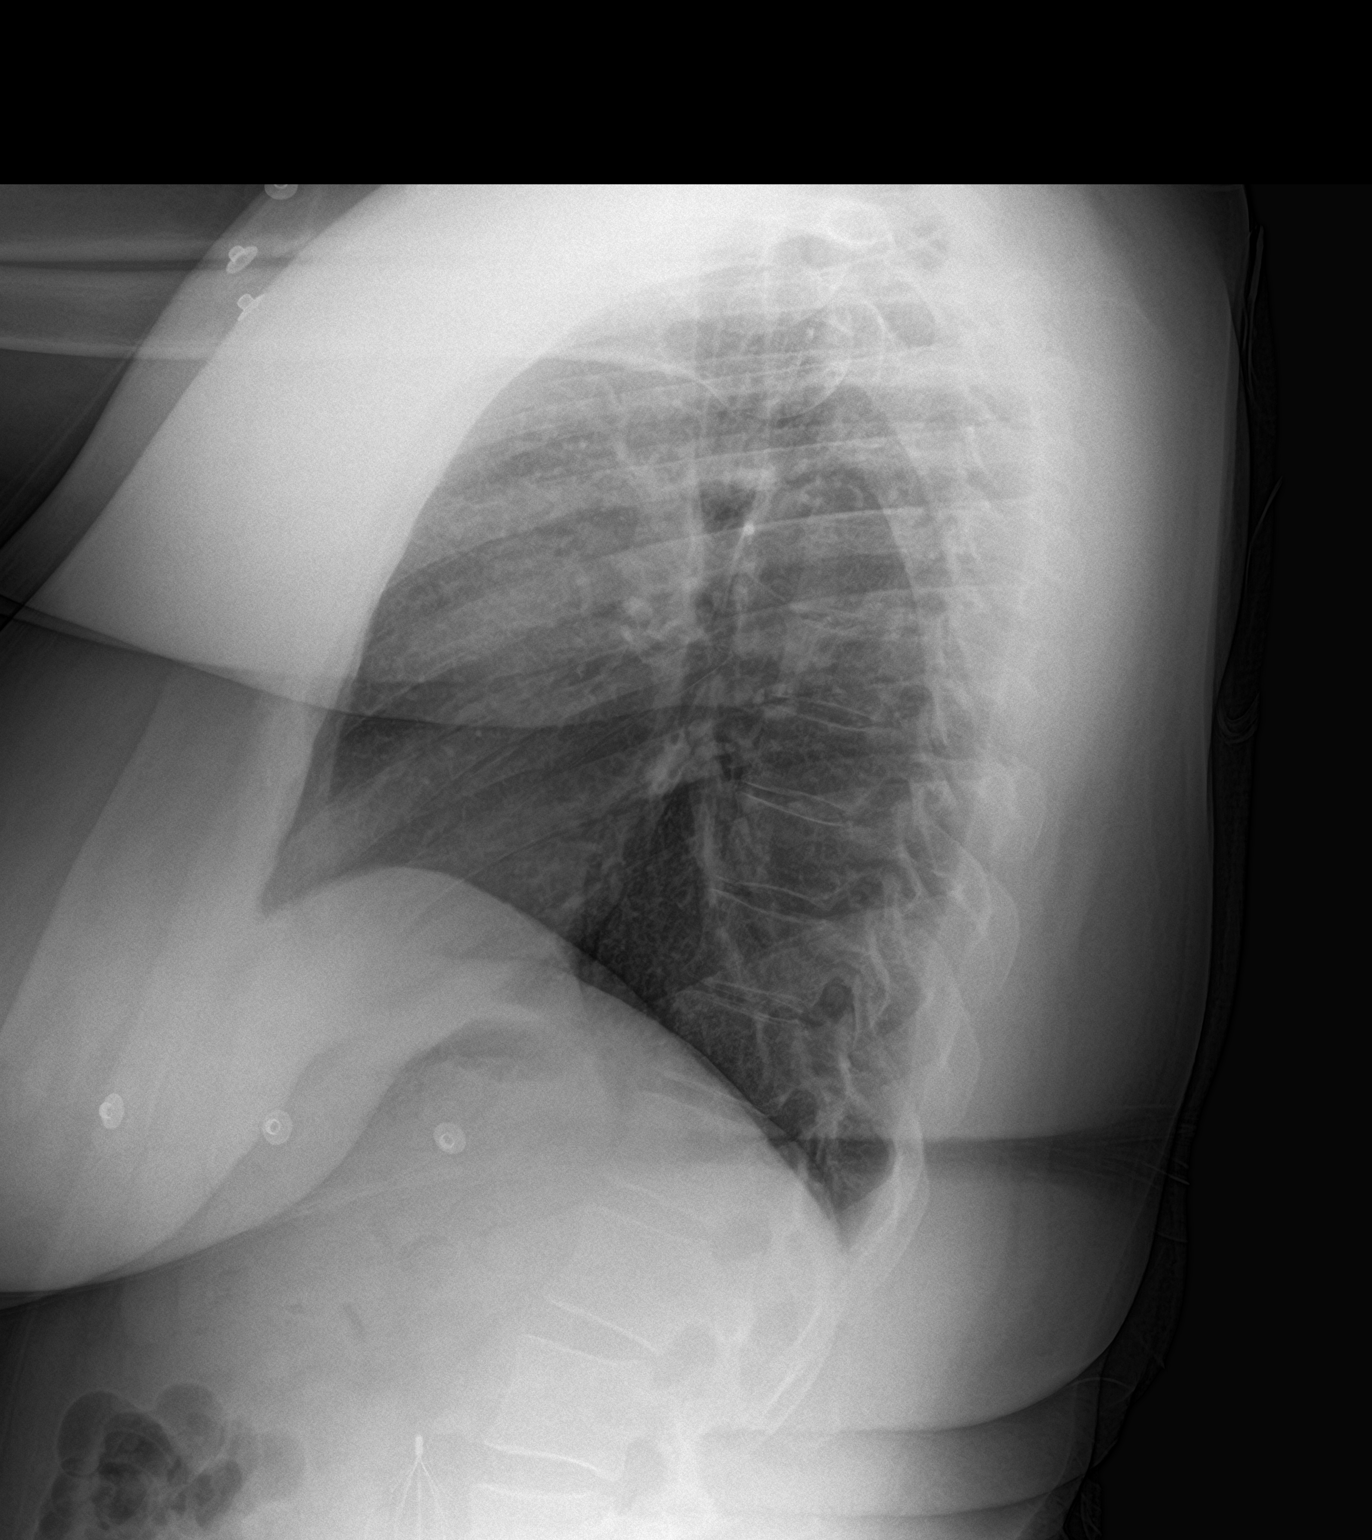

[2 of 2 positions shown; findings below may reference images not displayed]

FINDINGS: The heart size and mediastinal contours are within normal limits.
Both lungs are clear. The visualized skeletal structures are
unremarkable.
IMPRESSION: Negative two view chest x-ray

## 2020-06-12 ENCOUNTER — Encounter (HOSPITAL_COMMUNITY): Payer: Self-pay | Admitting: Emergency Medicine

## 2020-12-03 ENCOUNTER — Encounter (HOSPITAL_COMMUNITY): Payer: Self-pay | Admitting: Emergency Medicine

## 2020-12-03 ENCOUNTER — Other Ambulatory Visit: Payer: Self-pay

## 2020-12-03 ENCOUNTER — Emergency Department (HOSPITAL_COMMUNITY)
Admission: EM | Admit: 2020-12-03 | Discharge: 2020-12-03 | Disposition: A | Discharge status: Home / Self Care | Payer: Self-pay | Attending: Emergency Medicine | Admitting: Emergency Medicine

## 2020-12-03 DIAGNOSIS — M79604 Pain in right leg: Secondary | ICD-10-CM | POA: Insufficient documentation

## 2020-12-03 DIAGNOSIS — Z86718 Personal history of other venous thrombosis and embolism: Secondary | ICD-10-CM | POA: Insufficient documentation

## 2020-12-03 DIAGNOSIS — Z9114 Patient's other noncompliance with medication regimen: Secondary | ICD-10-CM | POA: Insufficient documentation

## 2020-12-03 DIAGNOSIS — I10 Essential (primary) hypertension: Secondary | ICD-10-CM | POA: Insufficient documentation

## 2020-12-03 DIAGNOSIS — F17210 Nicotine dependence, cigarettes, uncomplicated: Secondary | ICD-10-CM | POA: Insufficient documentation

## 2020-12-03 DIAGNOSIS — M79661 Pain in right lower leg: Secondary | ICD-10-CM

## 2020-12-03 MED ORDER — RIVAROXABAN (XARELTO) VTE STARTER PACK (15 & 20 MG): 15.0000 mg | ORAL_TABLET | Freq: Two times a day (BID) | ORAL | 0 refills | Status: AC

## 2020-12-03 MED ORDER — RIVAROXABAN 15 MG PO TABS
15.0000 mg | ORAL_TABLET | Freq: Once | ORAL | Status: AC
  Administered 2020-12-03: 15 mg via ORAL
  Filled 2020-12-03: qty 1

## 2020-12-03 NOTE — Discharge Instructions (Signed)
Go to [facility] in the morning for the doppler study as discussed. It will be important for you to take Xarelto daily if your DVT study is positive - 15 mg twice daily for the first 21 days, then 20 mg once daily after that. Your doctor can manage future prescriptions and rechecks.   If DVT study is negative, please consult your physician on continuation of Xarelto.

## 2020-12-03 NOTE — ED Provider Notes (Signed)
[facility]-EMERGENCY DEPT Provider Note   CSN: [id number] Arrival date & time: 12/03/20  2157     History No chief complaint on file.   Betty Harris is a 35 y.o. female.  Patient with h/o PE s/p IVC filter placement, DVT presents with right calf swelling and pain for 4 days. No SOB, CP. She reports that she stopped taking Xarelto about 1 year ago which was self directed. No injury, fever.   The history is provided by the patient. No language interpreter was used.      Past Medical History:  Diagnosis Date   History of blood clots    Hypertension    PTSD (post-traumatic stress disorder)     Patient Active Problem List   Diagnosis Date Noted   History of hysterectomy 02/21/2017   Breast mass in female 02/22/2013   Pulmonary embolism (HCC) 02/19/2013   Acute thromboembolism of deep veins of lower extremity (HCC) 10/30/2012    Past Surgical History:  Procedure Laterality Date   ABDOMINAL HYSTERECTOMY     VENA CAVA FILTER PLACEMENT       OB History     Gravida  2   Para  0   Term  0   Preterm  0   AB  1   Living         SAB  1   IAB  0   Ectopic  0   Multiple      Live Births              Family History  Problem Relation Age of Onset   Hypertension Mother    Hypertension Father     Social History   Tobacco Use   Smoking status: Every Day    Types: Cigarettes    Last attempt to quit: 01/12/2017    Years since quitting: 3.8   Smokeless tobacco: Never  Vaping Use   Vaping Use: Never used  Substance Use Topics   Alcohol use: Yes    Alcohol/week: 1.0 - 2.0 standard drink    Types: 1 - 2 Glasses of wine per week    Comment: OCC   Drug use: No    Home Medications Prior to Admission medications   Medication Sig Start Date End Date Taking? Authorizing Provider  escitalopram (LEXAPRO) 10 MG tablet Take 1/2 tab daily for 1 week and than full tab daily 01/27/18   Arfeen, Phillips Grout, MD  methocarbamol (ROBAXIN)  500 MG tablet Take 500 mg by mouth 3 (three) times daily as needed for muscle spasms.  09/26/17   [provider]  ondansetron (ZOFRAN) 4 MG tablet Take 1 tablet (4 mg total) by mouth every 6 (six) hours. 04/17/18   Raeford Razor, MD  rivaroxaban (XARELTO) 10 MG TABS tablet Take 1 tablet (10 mg total) by mouth daily. 01/24/18 04/24/18  Arthur Holms, MD    Allergies    Patient has no known allergies.  Review of Systems   Review of Systems  Constitutional:  Negative for fever.  Respiratory:  Negative for shortness of breath.   Cardiovascular:  Negative for chest pain.  Musculoskeletal:        See HPI.  Skin:  Negative for color change.  Neurological:  Negative for weakness and numbness.   Physical Exam Updated Vital Signs BP 140/90 (BP Location: Right Arm)   Pulse 87   Temp 99 F (37.2 C) (Oral)   Resp 16   Ht 5\' 9"  (1.753 m)  Wt 108.9 kg   LMP 08/06/2010   SpO2 100%   BMI 35.44 kg/m   Physical Exam Vitals and nursing note reviewed.  Constitutional:      General: She is not in acute distress.    Appearance: Normal appearance. She is obese.  Pulmonary:     Effort: Pulmonary effort is normal.  Musculoskeletal:        General: Swelling and tenderness present. Normal range of motion.     Comments: Right calf swollen. Tender to palpation which extends to medial and distal thigh. Pulses present distally. No redness or warmth.  Skin:    General: Skin is warm and dry.  Neurological:     Mental Status: She is alert and oriented to person, place, and time.    ED Results / Procedures / Treatments   Labs (all labs ordered are listed, but only abnormal results are displayed) Labs Reviewed - No data to display  EKG None  Radiology No results found.  Procedures Procedures   Medications Ordered in ED Medications  Rivaroxaban (XARELTO) tablet 15 mg (has no administration in time range)    ED Course  I have reviewed the triage vital signs and the nursing  notes.  Pertinent labs & imaging results that were available during my care of the patient were reviewed by me and considered in my medical decision making (see chart for details).    MDM Rules/Calculators/A&P                           Patient with history of DVT/PE, off Xarelto x 1 year. Here with right LE swelling and pain x 4 days.   VSS. Patient is high risk for DVT. No CP, SOB. S/p IVC filter. Will restart Xarelto at 15 mg and have her return in the morning for doppler study and further treatment.   Final Clinical Impression(s) / ED Diagnoses Final diagnoses:  None   Right leg pain History of DVT Medication noncompliance  Rx / DC Orders ED Discharge Orders     None        Danne Harbor 12/03/20 2333    Bethann Berkshire, MD 12/05/20 1014

## 2020-12-03 NOTE — ED Triage Notes (Signed)
Patient complaining of having a blood clot in the right calf. Patient has a filter and has a hx of pe's.

## 2020-12-04 ENCOUNTER — Ambulatory Visit (HOSPITAL_COMMUNITY)
Admission: RE | Admit: 2020-12-04 | Discharge: 2020-12-04 | Disposition: A | Discharge status: Home / Self Care | Payer: Self-pay | Source: Ambulatory Visit | Attending: Emergency Medicine | Admitting: Emergency Medicine

## 2020-12-04 DIAGNOSIS — M79604 Pain in right leg: Secondary | ICD-10-CM

## 2020-12-04 DIAGNOSIS — M79661 Pain in right lower leg: Secondary | ICD-10-CM | POA: Insufficient documentation

## 2020-12-04 NOTE — Progress Notes (Signed)
Lower extremity venous has been completed.   Preliminary results in CV Proc.   Rane Dumm Kionte Baumgardner 12/04/2020 9:01 AM
